# Patient Record
Sex: Female | Born: 1950 | Race: White | Hispanic: No | Smoking: Never smoker
Health system: Southern US, Community
[De-identification: ages and names within clinical notes are randomized; demographics above are authoritative.]

## PROBLEM LIST (undated history)

## (undated) DIAGNOSIS — K2970 Gastritis, unspecified, without bleeding: Secondary | ICD-10-CM

## (undated) DIAGNOSIS — E119 Type 2 diabetes mellitus without complications: Secondary | ICD-10-CM

## (undated) DIAGNOSIS — E039 Hypothyroidism, unspecified: Secondary | ICD-10-CM

## (undated) DIAGNOSIS — N819 Female genital prolapse, unspecified: Secondary | ICD-10-CM

## (undated) DIAGNOSIS — K219 Gastro-esophageal reflux disease without esophagitis: Secondary | ICD-10-CM

## (undated) DIAGNOSIS — I10 Essential (primary) hypertension: Secondary | ICD-10-CM

## (undated) DIAGNOSIS — K589 Irritable bowel syndrome without diarrhea: Secondary | ICD-10-CM

## (undated) DIAGNOSIS — R42 Dizziness and giddiness: Secondary | ICD-10-CM

## (undated) DIAGNOSIS — H409 Unspecified glaucoma: Secondary | ICD-10-CM

## (undated) DIAGNOSIS — N189 Chronic kidney disease, unspecified: Secondary | ICD-10-CM

## (undated) DIAGNOSIS — N952 Postmenopausal atrophic vaginitis: Secondary | ICD-10-CM

## (undated) DIAGNOSIS — N390 Urinary tract infection, site not specified: Secondary | ICD-10-CM

## (undated) DIAGNOSIS — K635 Polyp of colon: Secondary | ICD-10-CM

## (undated) DIAGNOSIS — E041 Nontoxic single thyroid nodule: Secondary | ICD-10-CM

## (undated) DIAGNOSIS — C801 Malignant (primary) neoplasm, unspecified: Secondary | ICD-10-CM

## (undated) DIAGNOSIS — G2581 Restless legs syndrome: Secondary | ICD-10-CM

## (undated) DIAGNOSIS — K5730 Diverticulosis of large intestine without perforation or abscess without bleeding: Secondary | ICD-10-CM

## (undated) HISTORY — PX: DILATION AND CURETTAGE OF UTERUS: SHX78

## (undated) HISTORY — PX: CHOLECYSTECTOMY: SHX55

## (undated) HISTORY — PX: TUBAL LIGATION: SHX77

---

## 2004-01-20 ENCOUNTER — Ambulatory Visit: Payer: Self-pay | Admitting: Internal Medicine

## 2004-08-19 ENCOUNTER — Ambulatory Visit: Payer: Self-pay | Admitting: Urology

## 2005-05-21 ENCOUNTER — Ambulatory Visit: Payer: Self-pay | Admitting: Internal Medicine

## 2006-07-08 ENCOUNTER — Ambulatory Visit: Payer: Self-pay | Admitting: Internal Medicine

## 2006-08-24 ENCOUNTER — Ambulatory Visit: Payer: Self-pay | Admitting: Unknown Physician Specialty

## 2006-09-01 ENCOUNTER — Ambulatory Visit: Payer: Self-pay | Admitting: Unknown Physician Specialty

## 2006-12-05 ENCOUNTER — Ambulatory Visit: Payer: Self-pay | Admitting: Internal Medicine

## 2007-01-25 ENCOUNTER — Ambulatory Visit: Payer: Self-pay | Admitting: Internal Medicine

## 2007-01-31 ENCOUNTER — Ambulatory Visit: Payer: Self-pay | Admitting: Internal Medicine

## 2007-03-30 ENCOUNTER — Ambulatory Visit: Payer: Self-pay | Admitting: Internal Medicine

## 2007-09-05 ENCOUNTER — Ambulatory Visit: Payer: Self-pay | Admitting: Internal Medicine

## 2007-10-14 ENCOUNTER — Emergency Department: Payer: Self-pay | Admitting: Emergency Medicine

## 2007-10-14 ENCOUNTER — Other Ambulatory Visit: Payer: Self-pay

## 2008-12-25 ENCOUNTER — Ambulatory Visit: Payer: Self-pay | Admitting: Internal Medicine

## 2009-06-02 ENCOUNTER — Ambulatory Visit: Payer: Self-pay | Admitting: Unknown Physician Specialty

## 2009-07-30 ENCOUNTER — Ambulatory Visit: Payer: Self-pay | Admitting: Unknown Physician Specialty

## 2010-04-30 ENCOUNTER — Ambulatory Visit: Payer: Self-pay | Admitting: Obstetrics and Gynecology

## 2010-04-30 ENCOUNTER — Ambulatory Visit: Payer: Self-pay | Admitting: Internal Medicine

## 2011-05-20 ENCOUNTER — Ambulatory Visit: Payer: Self-pay | Admitting: Obstetrics and Gynecology

## 2012-07-11 ENCOUNTER — Ambulatory Visit: Payer: Self-pay | Admitting: Internal Medicine

## 2013-01-22 ENCOUNTER — Ambulatory Visit: Payer: Self-pay | Admitting: Unknown Physician Specialty

## 2013-01-23 LAB — PATHOLOGY REPORT

## 2013-04-27 ENCOUNTER — Emergency Department: Payer: Self-pay | Admitting: Emergency Medicine

## 2013-04-27 LAB — BASIC METABOLIC PANEL
Anion Gap: 5 — ABNORMAL LOW (ref 7–16)
BUN: 16 mg/dL (ref 7–18)
Calcium, Total: 9.3 mg/dL (ref 8.5–10.1)
Chloride: 102 mmol/L (ref 98–107)
Co2: 28 mmol/L (ref 21–32)
Creatinine: 0.76 mg/dL (ref 0.60–1.30)
EGFR (Non-African Amer.): >60
Glucose: 230 mg/dL — ABNORMAL HIGH (ref 65–99)
Osmolality: 279 (ref 275–301)
Potassium: 3.6 mmol/L (ref 3.5–5.1)
Sodium: 135 mmol/L — ABNORMAL LOW (ref 136–145)

## 2013-04-27 LAB — URINALYSIS, COMPLETE
BACTERIA: NONE SEEN
BILIRUBIN, UR: NEGATIVE
BLOOD: NEGATIVE
Glucose,UR: 150 mg/dL (ref 0–75)
KETONE: NEGATIVE
NITRITE: NEGATIVE
PH: 7 (ref 4.5–8.0)
PROTEIN: NEGATIVE
RBC,UR: <1 /HPF (ref 0–5)
SPECIFIC GRAVITY: 1.005 (ref 1.003–1.030)
Squamous Epithelial: 1

## 2013-04-27 LAB — CBC WITH DIFFERENTIAL/PLATELET
BASOS PCT: 0.9 %
Basophil #: 0.1 10*3/uL (ref 0.0–0.1)
EOS ABS: 0.2 10*3/uL (ref 0.0–0.7)
EOS PCT: 2.9 %
HCT: 37 % (ref 35.0–47.0)
HGB: 12.8 g/dL (ref 12.0–16.0)
LYMPHS ABS: 2.6 10*3/uL (ref 1.0–3.6)
Lymphocyte %: 45.1 %
MCH: 30 pg (ref 26.0–34.0)
MCHC: 34.7 g/dL (ref 32.0–36.0)
MCV: 86 fL (ref 80–100)
MONO ABS: 0.4 x10 3/mm (ref 0.2–0.9)
Monocyte %: 6.5 %
NEUTROS PCT: 44.6 %
Neutrophil #: 2.6 10*3/uL (ref 1.4–6.5)
Platelet: 249 10*3/uL (ref 150–440)
RBC: 4.28 10*6/uL (ref 3.80–5.20)
RDW: 12.9 % (ref 11.5–14.5)
WBC: 5.8 10*3/uL (ref 3.6–11.0)

## 2013-04-27 LAB — TROPONIN I: Troponin-I: <0.02 ng/mL

## 2013-04-29 LAB — URINE CULTURE

## 2013-09-11 ENCOUNTER — Ambulatory Visit: Payer: Self-pay | Admitting: Obstetrics and Gynecology

## 2013-10-18 ENCOUNTER — Ambulatory Visit: Payer: Self-pay | Admitting: Internal Medicine

## 2015-01-16 ENCOUNTER — Encounter
Admission: RE | Admit: 2015-01-16 | Discharge: 2015-01-16 | Disposition: A | Discharge status: Home / Self Care | Payer: 59 | Source: Ambulatory Visit | Attending: Otolaryngology | Admitting: Otolaryngology

## 2015-01-16 DIAGNOSIS — Z01812 Encounter for preprocedural laboratory examination: Secondary | ICD-10-CM | POA: Insufficient documentation

## 2015-01-16 DIAGNOSIS — Z01810 Encounter for preprocedural cardiovascular examination: Secondary | ICD-10-CM | POA: Insufficient documentation

## 2015-01-16 HISTORY — DX: Postmenopausal atrophic vaginitis: N95.2

## 2015-01-16 HISTORY — DX: Type 2 diabetes mellitus without complications: E11.9

## 2015-01-16 HISTORY — DX: Female genital prolapse, unspecified: N81.9

## 2015-01-16 HISTORY — DX: Gastro-esophageal reflux disease without esophagitis: K21.9

## 2015-01-16 HISTORY — DX: Polyp of colon: K63.5

## 2015-01-16 HISTORY — DX: Restless legs syndrome: G25.81

## 2015-01-16 HISTORY — DX: Diverticulosis of large intestine without perforation or abscess without bleeding: K57.30

## 2015-01-16 HISTORY — DX: Dizziness and giddiness: R42

## 2015-01-16 HISTORY — DX: Chronic kidney disease, unspecified: N18.9

## 2015-01-16 HISTORY — DX: Unspecified glaucoma: H40.9

## 2015-01-16 HISTORY — DX: Nontoxic single thyroid nodule: E04.1

## 2015-01-16 HISTORY — DX: Irritable bowel syndrome without diarrhea: K58.9

## 2015-01-16 HISTORY — DX: Essential (primary) hypertension: I10

## 2015-01-16 HISTORY — DX: Malignant (primary) neoplasm, unspecified: C80.1

## 2015-01-16 HISTORY — DX: Gastritis, unspecified, without bleeding: K29.70

## 2015-01-16 HISTORY — DX: Urinary tract infection, site not specified: N39.0

## 2015-01-16 LAB — CBC
HEMATOCRIT: 38 % (ref 35.0–47.0)
HEMOGLOBIN: 12.9 g/dL (ref 12.0–16.0)
MCH: 29 pg (ref 26.0–34.0)
MCHC: 33.9 g/dL (ref 32.0–36.0)
MCV: 85.4 fL (ref 80.0–100.0)
Platelets: 283 10*3/uL (ref 150–440)
RBC: 4.45 MIL/uL (ref 3.80–5.20)
RDW: 13.6 % (ref 11.5–14.5)
WBC: 5.5 10*3/uL (ref 3.6–11.0)

## 2015-01-16 LAB — BASIC METABOLIC PANEL
Anion gap: 7 (ref 5–15)
BUN: 14 mg/dL (ref 6–20)
CHLORIDE: 104 mmol/L (ref 101–111)
CO2: 27 mmol/L (ref 22–32)
Calcium: 9.7 mg/dL (ref 8.9–10.3)
Creatinine, Ser: 0.63 mg/dL (ref 0.44–1.00)
GFR calc non Af Amer: >60 mL/min (ref >60)
Glucose, Bld: 274 mg/dL — ABNORMAL HIGH (ref 65–99)
POTASSIUM: 4.2 mmol/L (ref 3.5–5.1)
SODIUM: 138 mmol/L (ref 135–145)

## 2015-01-16 NOTE — Patient Instructions (Signed)
  Your procedure is scheduled on: January 29, 2015 (Wednesday) Report to Day Surgery.[facility]) To find out your arrival time please call [phone] between 1PM - 3PM on January 28, 2015 (Tuesday).  Remember: Instructions that are not followed completely may result in serious medical risk, up to and including death, or upon the discretion of your surgeon and anesthesiologist your surgery may need to be rescheduled.    __x__ 1. Do not eat food or drink liquids after midnight. No gum chewing or hard candies.     __x__ 2. No Alcohol for 24 hours before or after surgery.   ____ 3. Bring all medications with you on the day of surgery if instructed.    __x__ 4. Notify your doctor if there is any change in your medical condition     (cold, fever, infections).     Do not wear jewelry, make-up, hairpins, clips or nail polish.  Do not wear lotions, powders, or perfumes. You may wear deodorant.  Do not shave 48 hours prior to surgery. Men may shave face and neck.  Do not bring valuables to the hospital.    [facility] is not responsible for any belongings or valuables.               Contacts, dentures or bridgework may not be worn into surgery.  Leave your suitcase in the car. After surgery it may be brought to your room.  For patients admitted to the hospital, discharge time is determined by your                treatment team.   Patients discharged the day of surgery will not be allowed to drive home.   Please read over the following fact sheets that you were given:      __x__ Take these medicines the morning of surgery with A SIP OF WATER:    1. Ranitidine (Zantac)  2.   3.   4.  5.  6.  ____ Fleet Enema (as directed)   ____ Use CHG Soap as directed  ____ Use inhalers on the day of surgery  _x___ Stop metformin 2 days prior to surgery (STOP METFORMIN ON November 28)    _x___ Take 1/2 of usual insulin dose the night before surgery and none on the morning of surgery. (NO  VICTOZA THE MORNING OF SURGERY)  ____ Stop Coumadin/Plavix/aspirin on (PATIENT HAS STOPPED ASPIRIN )  __x__ Stop Anti-inflammatories on (TYLENOL OK TO TAKE FOR PAIN IF NEEDED)   ____ Stop supplements until after surgery.    ____ Bring C-Pap to the hospital.

## 2015-01-16 NOTE — OR Nursing (Signed)
Ekg to anesthesia for review 

## 2015-01-20 NOTE — Pre-Procedure Instructions (Signed)
EKG reviewed by Dr. Thomas and "ok for surgery"  

## 2015-01-22 ENCOUNTER — Encounter: Payer: Self-pay | Admitting: Urology

## 2015-01-22 ENCOUNTER — Ambulatory Visit (INDEPENDENT_AMBULATORY_CARE_PROVIDER_SITE_OTHER): Payer: 59 | Admitting: Urology

## 2015-01-22 VITALS — BP 155/79 | HR 82 | Ht 61.0 in | Wt 141.6 lb

## 2015-01-22 DIAGNOSIS — N952 Postmenopausal atrophic vaginitis: Secondary | ICD-10-CM | POA: Diagnosis not present

## 2015-01-22 DIAGNOSIS — N950 Postmenopausal bleeding: Secondary | ICD-10-CM

## 2015-01-22 DIAGNOSIS — N390 Urinary tract infection, site not specified: Secondary | ICD-10-CM | POA: Diagnosis not present

## 2015-01-22 NOTE — Progress Notes (Signed)
01/22/2015 8:29 PM   Lemar Lofty Aug 06, 1950 [id number]  Referring provider: No referring provider defined for this encounter.  Chief Complaint  Patient presents with  . atrophic vaginitis    one year recheck    HPI: Patient is a 64 year old white female with a history of recurrent UTI's and atrophic vaginitis who presents today for a yearly follow up.    Recurrent UTI's Patient has had three E. Coli UTI's over the last year with a variable resistance pattern. (07/09/215, 04/04/2014, and 04/17/2014).  She underwent a pelvic US in 2015 and was found to have an uterine fibroid.  She underwent a cystoscopy on 10/29/2013 and no pathology was found.  She was placed on vaginal estrogen cream, but she has been hesitant to apply the medication.  She has not applied any cream for several months.  She takes a daily Macrobid prescribed by her PCP and that has kept her UTI's at [place].  Atrophic vaginitis Patient has not been using her vaginal estrogen cream.  She is afraid it will cause an UTI.  I explained how an atrophic vaginitis can contribute to recurrent UTI's and how the cream may prevent infections.    PMH: Past Medical History  Diagnosis Date  . Hypertension   . Diabetes mellitus without complication ([place])   . GERD (gastroesophageal reflux disease)   . Chronic kidney disease     UTI  . Cancer (HCC)     Skin  . Diverticulosis of colon   . Restless leg syndrome   . Glaucoma (increased eye pressure)   . Vertigo   . Thyroid nodule   . Gastritis   . Colon polyps   . IBS (irritable bowel syndrome)   . Atrophic vaginitis   . 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 UTI (lower urinary tract infection)     Surgical History: Past Surgical History  Procedure Laterality Date  . Cholecystectomy    . Tubal ligation    . Dilation and curettage of uterus      Home Medications:    Medication List       This list is accurate as of: 01/22/15 11:59 PM.  Always use your most  recent med list.               aspirin EC 81 MG tablet  Take 81 mg by mouth daily.     CALCIUM 500+D3 PO  Take 1 tablet by mouth 2 (two) times daily.     Fish Oil 1000 MG Caps  Take by mouth.     glipiZIDE 10 MG tablet  Commonly known as:  GLUCOTROL  Take 10 mg by mouth 2 (two) times daily after a meal.     hydrochlorothiazide 12.5 MG capsule  Commonly known as:  MICROZIDE  Take 12.5 mg by mouth daily.     latanoprost 0.005 % ophthalmic solution  Commonly known as:  XALATAN  Place 1 drop into both eyes at bedtime.     losartan 50 MG tablet  Commonly known as:  COZAAR  Take 50 mg by mouth at bedtime.     metFORMIN 1000 MG tablet  Commonly known as:  GLUCOPHAGE  Take 1,000 mg by mouth 2 (two) times daily with a meal.     NITROFURANTOIN MONOHYD MACRO PO  Take 100 mg by mouth daily.     ONE TOUCH ULTRA MINI W/DEVICE Kit  Use as directed. Dx code: E11.9     pravastatin 40 MG  tablet  Commonly known as:  PRAVACHOL  Take 40 mg by mouth at bedtime.     ranitidine 300 MG tablet  Commonly known as:  ZANTAC  Take 300 mg by mouth 2 (two) times daily.     VICTOZA 18 MG/3ML Sopn  Generic drug:  Liraglutide  Inject into the skin daily before breakfast.        Allergies:  Allergies  Allergen Reactions  . Lisinopril Cough  . Sulfa Antibiotics Swelling    Family History: Family History  Problem Relation Age of Onset  . Stomach cancer Father   . Heart attack Mother   . Diabetes Mellitus II Mother   . Diabetes Mellitus II Maternal Grandmother   . Kidney disease Father   . Prostate cancer Neg Hx   . Bladder Cancer Neg Hx     Social History:  reports that she has never smoked. She has never used smokeless tobacco. She reports that she drinks alcohol. She reports that she does not use illicit drugs.  ROS: UROLOGY Frequent Urination?: No Hard to postpone urination?: No Burning/pain with urination?: No Get up at night to urinate?: No Leakage of urine?:  No Urine stream starts and stops?: No Trouble starting stream?: No Do you have to strain to urinate?: No Blood in urine?: No Urinary tract infection?: No Sexually transmitted disease?: No Injury to kidneys or bladder?: No Painful intercourse?: No Weak stream?: No Currently pregnant?: No Vaginal bleeding?: No Last menstrual period?: n  Gastrointestinal Nausea?: No Vomiting?: No Indigestion/heartburn?: No Diarrhea?: No Constipation?: No  Constitutional Fever: No Night sweats?: No Weight loss?: No Fatigue?: No  Skin Skin rash/lesions?: No Itching?: No  Eyes Blurred vision?: No Double vision?: No  Ears/Nose/Throat Sore throat?: No Sinus problems?: No  Hematologic/Lymphatic Swollen glands?: No Easy bruising?: No  Cardiovascular Leg swelling?: No Chest pain?: No  Respiratory Cough?: No Shortness of breath?: No  Endocrine Excessive thirst?: No  Musculoskeletal Back pain?: No Joint pain?: No  Neurological Headaches?: No Dizziness?: No  Psychologic Depression?: No Anxiety?: No  Physical Exam: BP 155/79 mmHg  Pulse 82  Ht '5\' 1"'  (1.549 m)  Wt 141 lb 9.6 oz (64.229 kg)  BMI 26.77 kg/m2  Constitutional: Well nourished. Alert and oriented, No acute distress. HEENT: [place] AT, moist mucus membranes. Trachea midline, no masses. Cardiovascular: No clubbing, cyanosis, or edema. Respiratory: Normal respiratory effort, no increased work of breathing. GI: Abdomen is soft, non tender, non distended, no abdominal masses. Liver and spleen not palpable.  No hernias appreciated.  Stool sample for occult testing is not indicated.   GU: No CVA tenderness.  No bladder fullness or masses.  Atrophic external genitalia, normal pubic hair distribution, no lesions.  Normal urethral meatus, no lesions, no prolapse, no discharge.   No urethral masses, tenderness and/or tenderness. No bladder fullness, tenderness or masses. Normal vagina mucosa, good estrogen effect, bloody  discharge, no lesions, good pelvic support, no cystocele or rectocele noted.  No cervical motion tenderness.  Uterus is freely mobile and non-fixed.  No adnexal/parametria masses or tenderness noted.  Anus and perineum are without rashes or lesions.    Skin: No rashes, bruises or suspicious lesions. Lymph: No cervical or inguinal adenopathy. Neurologic: Grossly intact, no focal deficits, moving all 4 extremities. Psychiatric: Normal mood and affect.  Laboratory Data: Lab Results  Component Value Date   WBC 5.5 01/16/2015   HGB 12.9 01/16/2015   HCT 38.0 01/16/2015   MCV 85.4 01/16/2015   PLT 283 01/16/2015  Lab Results  Component Value Date   CREATININE 0.63 01/16/2015    Assessment & Plan:    1. Post-menopausal bleeding:   We will refer to Dr. Ouida Sills at [facility] for further evaluation and management.    2. Recurrent UTI's:   Patient's PCP prescribes daily Macrobid for the patient.    3. Atrophic vaginitis:   Patient has not been applying her vaginal estrogen cream.    There are no diagnoses linked to this encounter.  Return for refer to Dr. Ouida Sills for post menopausal  bleeding.  Zara Council, [facility] [address] [place], [place] [address] [phone]

## 2015-01-26 DIAGNOSIS — N952 Postmenopausal atrophic vaginitis: Secondary | ICD-10-CM | POA: Insufficient documentation

## 2015-01-26 DIAGNOSIS — N390 Urinary tract infection, site not specified: Secondary | ICD-10-CM | POA: Insufficient documentation

## 2015-01-29 ENCOUNTER — Ambulatory Visit: Payer: 59 | Admitting: *Deleted

## 2015-01-29 ENCOUNTER — Observation Stay
Admission: RE | Admit: 2015-01-29 | Discharge: 2015-01-30 | Disposition: A | Discharge status: Home / Self Care | Payer: 59 | Source: Ambulatory Visit | Attending: Otolaryngology | Admitting: Otolaryngology

## 2015-01-29 ENCOUNTER — Encounter: Payer: Self-pay | Admitting: *Deleted

## 2015-01-29 ENCOUNTER — Encounter
Admission: RE | Disposition: A | Discharge status: Home / Self Care | Payer: Self-pay | Source: Ambulatory Visit | Attending: Otolaryngology

## 2015-01-29 DIAGNOSIS — Z9851 Tubal ligation status: Secondary | ICD-10-CM | POA: Diagnosis not present

## 2015-01-29 DIAGNOSIS — Z9889 Other specified postprocedural states: Secondary | ICD-10-CM | POA: Diagnosis not present

## 2015-01-29 DIAGNOSIS — Z882 Allergy status to sulfonamides status: Secondary | ICD-10-CM | POA: Insufficient documentation

## 2015-01-29 DIAGNOSIS — E063 Autoimmune thyroiditis: Secondary | ICD-10-CM | POA: Insufficient documentation

## 2015-01-29 DIAGNOSIS — D34 Benign neoplasm of thyroid gland: Secondary | ICD-10-CM | POA: Diagnosis not present

## 2015-01-29 DIAGNOSIS — Z79899 Other long term (current) drug therapy: Secondary | ICD-10-CM | POA: Diagnosis not present

## 2015-01-29 DIAGNOSIS — E041 Nontoxic single thyroid nodule: Secondary | ICD-10-CM | POA: Diagnosis present

## 2015-01-29 DIAGNOSIS — E785 Hyperlipidemia, unspecified: Secondary | ICD-10-CM | POA: Diagnosis not present

## 2015-01-29 DIAGNOSIS — I10 Essential (primary) hypertension: Secondary | ICD-10-CM | POA: Diagnosis not present

## 2015-01-29 DIAGNOSIS — Z7982 Long term (current) use of aspirin: Secondary | ICD-10-CM | POA: Insufficient documentation

## 2015-01-29 DIAGNOSIS — Z794 Long term (current) use of insulin: Secondary | ICD-10-CM | POA: Diagnosis not present

## 2015-01-29 DIAGNOSIS — Z9049 Acquired absence of other specified parts of digestive tract: Secondary | ICD-10-CM | POA: Diagnosis not present

## 2015-01-29 DIAGNOSIS — E119 Type 2 diabetes mellitus without complications: Secondary | ICD-10-CM | POA: Insufficient documentation

## 2015-01-29 DIAGNOSIS — Z888 Allergy status to other drugs, medicaments and biological substances status: Secondary | ICD-10-CM | POA: Insufficient documentation

## 2015-01-29 HISTORY — PX: THYROIDECTOMY: SHX17

## 2015-01-29 LAB — CALCIUM: Calcium: 8.6 mg/dL — ABNORMAL LOW (ref 8.9–10.3)

## 2015-01-29 LAB — GLUCOSE, CAPILLARY
Glucose-Capillary: 135 mg/dL — ABNORMAL HIGH (ref 65–99)
Glucose-Capillary: 212 mg/dL — ABNORMAL HIGH (ref 65–99)
Glucose-Capillary: 195 mg/dL — ABNORMAL HIGH (ref 65–99)
Glucose-Capillary: 215 mg/dL — ABNORMAL HIGH (ref 65–99)
Glucose-Capillary: 176 mg/dL — ABNORMAL HIGH (ref 65–99)

## 2015-01-29 SURGERY — THYROIDECTOMY
Anesthesia: General | Site: Neck | Wound class: Clean

## 2015-01-29 MED ORDER — REMIFENTANIL HCL 1 MG IV SOLR
INTRAVENOUS | Status: DC | PRN
  Administered 2015-01-29: .02 ug/kg/min via INTRAVENOUS

## 2015-01-29 MED ORDER — MIDAZOLAM HCL 2 MG/2ML IJ SOLN
INTRAMUSCULAR | Status: DC | PRN
Start: 2015-01-29 — End: 2015-01-29
  Administered 2015-01-29: 2 mg via INTRAVENOUS

## 2015-01-29 MED ORDER — LIDOCAINE-EPINEPHRINE (PF) 1 %-1:200000 IJ SOLN
INTRAMUSCULAR | Status: DC | PRN
  Administered 2015-01-29: 5 mL

## 2015-01-29 MED ORDER — BACITRACIN 500 UNIT/GM EX OINT
1.0000 "application " | TOPICAL_OINTMENT | Freq: Three times a day (TID) | CUTANEOUS | Status: DC
  Administered 2015-01-29 – 2015-01-30 (×3): 1 via TOPICAL
  Filled 2015-01-29 (×6): qty 0.9

## 2015-01-29 MED ORDER — DOCUSATE SODIUM 100 MG PO CAPS
100.0000 mg | ORAL_CAPSULE | Freq: Two times a day (BID) | ORAL | Status: DC
  Administered 2015-01-29 (×2): 100 mg via ORAL
  Filled 2015-01-29 (×2): qty 1

## 2015-01-29 MED ORDER — INSULIN ASPART 100 UNIT/ML ~~LOC~~ SOLN
0.0000 [IU] | Freq: Three times a day (TID) | SUBCUTANEOUS | Status: DC
  Administered 2015-01-29: 5 [IU] via SUBCUTANEOUS
  Administered 2015-01-30: 3 [IU] via SUBCUTANEOUS
  Filled 2015-01-29: qty 3
  Filled 2015-01-29: qty 5

## 2015-01-29 MED ORDER — BACITRACIN ZINC 500 UNIT/GM EX OINT
TOPICAL_OINTMENT | CUTANEOUS | Status: AC
  Filled 2015-01-29: qty 28.35

## 2015-01-29 MED ORDER — KCL IN DEXTROSE-NACL 20-5-0.45 MEQ/L-%-% IV SOLN
INTRAVENOUS | Status: DC
  Administered 2015-01-29: 1000 mL via INTRAVENOUS
  Filled 2015-01-29 (×3): qty 1000

## 2015-01-29 MED ORDER — MORPHINE SULFATE (PF) 2 MG/ML IV SOLN
2.0000 mg | INTRAVENOUS | Status: DC | PRN
  Administered 2015-01-29 (×2): 2 mg via INTRAVENOUS
  Filled 2015-01-29 (×2): qty 1

## 2015-01-29 MED ORDER — HYDROCODONE-ACETAMINOPHEN 5-325 MG PO TABS
1.0000 | ORAL_TABLET | ORAL | Status: DC | PRN
  Administered 2015-01-29 – 2015-01-30 (×2): 1 via ORAL
  Filled 2015-01-29 (×2): qty 1

## 2015-01-29 MED ORDER — LIDOCAINE HCL (CARDIAC) 20 MG/ML IV SOLN
INTRAVENOUS | Status: DC | PRN
  Administered 2015-01-29: 40 mg via INTRAVENOUS

## 2015-01-29 MED ORDER — ONDANSETRON HCL 4 MG/2ML IJ SOLN: 4.0000 mg | INTRAMUSCULAR | Status: DC | PRN

## 2015-01-29 MED ORDER — LIDOCAINE-EPINEPHRINE (PF) 1 %-1:200000 IJ SOLN
INTRAMUSCULAR | Status: AC
  Filled 2015-01-29: qty 30

## 2015-01-29 MED ORDER — ONDANSETRON HCL 4 MG PO TABS: 4.0000 mg | ORAL_TABLET | ORAL | Status: DC | PRN

## 2015-01-29 MED ORDER — HYDROMORPHONE HCL 1 MG/ML IJ SOLN
0.2500 mg | INTRAMUSCULAR | Status: DC | PRN
  Administered 2015-01-29 (×2): 0.25 mg via INTRAVENOUS
  Administered 2015-01-29: 0.5 mg via INTRAVENOUS

## 2015-01-29 MED ORDER — ONDANSETRON HCL 4 MG/2ML IJ SOLN: 4.0000 mg | Freq: Once | INTRAMUSCULAR | Status: DC | PRN

## 2015-01-29 MED ORDER — SODIUM CHLORIDE 0.9 % IV SOLN
INTRAVENOUS | Status: DC
  Administered 2015-01-29 (×3): via INTRAVENOUS

## 2015-01-29 MED ORDER — FENTANYL CITRATE (PF) 100 MCG/2ML IJ SOLN
INTRAMUSCULAR | Status: AC
  Filled 2015-01-29: qty 2

## 2015-01-29 MED ORDER — PHENYLEPHRINE HCL 10 MG/ML IJ SOLN
INTRAMUSCULAR | Status: DC | PRN
  Administered 2015-01-29 (×2): 100 ug via INTRAVENOUS

## 2015-01-29 MED ORDER — ROCURONIUM BROMIDE 100 MG/10ML IV SOLN
INTRAVENOUS | Status: DC | PRN
  Administered 2015-01-29: 20 mg via INTRAVENOUS

## 2015-01-29 MED ORDER — SUCCINYLCHOLINE CHLORIDE 20 MG/ML IJ SOLN
INTRAMUSCULAR | Status: DC | PRN
  Administered 2015-01-29: 120 mg via INTRAVENOUS

## 2015-01-29 MED ORDER — PROPOFOL 10 MG/ML IV BOLUS
INTRAVENOUS | Status: DC | PRN
  Administered 2015-01-29: 30 mg via INTRAVENOUS
  Administered 2015-01-29: 40 mg via INTRAVENOUS
  Administered 2015-01-29: 130 mg via INTRAVENOUS

## 2015-01-29 MED ORDER — FENTANYL CITRATE (PF) 100 MCG/2ML IJ SOLN
INTRAMUSCULAR | Status: DC | PRN
  Administered 2015-01-29 (×3): 50 ug via INTRAVENOUS

## 2015-01-29 MED ORDER — ONDANSETRON HCL 4 MG/2ML IJ SOLN
INTRAMUSCULAR | Status: DC | PRN
  Administered 2015-01-29: 4 mg via INTRAVENOUS

## 2015-01-29 MED ORDER — HYDROMORPHONE HCL 1 MG/ML IJ SOLN
INTRAMUSCULAR | Status: AC
  Filled 2015-01-29: qty 1

## 2015-01-29 SURGICAL SUPPLY — 33 items
BLADE SURG 15 STRL LF DISP TIS (BLADE) ×1 IMPLANT
BLADE SURG 15 STRL SS (BLADE) ×2
CANISTER SUCT 1200ML W/VALVE (MISCELLANEOUS) ×3 IMPLANT
CORD BIP STRL DISP 12FT (MISCELLANEOUS) ×3 IMPLANT
DRAIN TLS ROUND 10FR (DRAIN) ×6 IMPLANT
DRAPE MAG INST 16X20 L/F (DRAPES) ×3 IMPLANT
DRSG TEGADERM 2-3/8X2-3/4 SM (GAUZE/BANDAGES/DRESSINGS) ×3 IMPLANT
ELECT LARYNGEAL 6/7 (MISCELLANEOUS)
ELECT LARYNGEAL 8/9 (MISCELLANEOUS) ×3
ELECTRODE LARYNGEAL 6/7 (MISCELLANEOUS) IMPLANT
ELECTRODE LARYNGEAL 8/9 (MISCELLANEOUS) ×1 IMPLANT
FORCEPS JEWEL BIP 4-3/4 STR (INSTRUMENTS) ×3 IMPLANT
GLOVE BIO SURGEON STRL SZ7.5 (GLOVE) ×6 IMPLANT
GOWN STRL REUS W/ TWL LRG LVL3 (GOWN DISPOSABLE) ×2 IMPLANT
GOWN STRL REUS W/TWL LRG LVL3 (GOWN DISPOSABLE) ×4
HARMONIC SCALPEL FOCUS (MISCELLANEOUS) ×3 IMPLANT
HEMOSTAT SURGICEL 2X3 (HEMOSTASIS) ×3 IMPLANT
HOOK STAY BLUNT/RETRACTOR 5M (MISCELLANEOUS) ×3 IMPLANT
KIT RM TURNOVER STRD PROC AR (KITS) ×3 IMPLANT
LABEL OR SOLS (LABEL) ×3 IMPLANT
NS IRRIG 500ML POUR BTL (IV SOLUTION) ×3 IMPLANT
PACK HEAD/NECK (MISCELLANEOUS) ×3 IMPLANT
PAD GROUND ADULT SPLIT (MISCELLANEOUS) ×3 IMPLANT
PROBE NEUROSIGN BIPOL (MISCELLANEOUS) ×1 IMPLANT
PROBE NEUROSIGN BIPOLAR (MISCELLANEOUS) ×2
SPONGE KITTNER 5P (MISCELLANEOUS) ×3 IMPLANT
SPONGE XRAY 4X4 16PLY STRL (MISCELLANEOUS) ×9 IMPLANT
SUT PROLENE 3 0 PS 2 (SUTURE) ×3 IMPLANT
SUT SILK 2 0 (SUTURE)
SUT SILK 2 0 SH (SUTURE) ×3 IMPLANT
SUT SILK 2-0 18XBRD TIE 12 (SUTURE) IMPLANT
SUT VIC AB 4-0 RB1 18 (SUTURE) ×3 IMPLANT
SYSTEM CHEST DRAIN TLS 7FR (DRAIN) IMPLANT

## 2015-01-29 NOTE — Anesthesia Procedure Notes (Addendum)
Procedure Name: Intubation Date/Time: 01/29/2015 7:45 AM Performed by: Allean Found Pre-anesthesia Checklist: Patient identified, Emergency Drugs available, Suction available, Patient being monitored and Timeout performed Patient Re-evaluated:Patient Re-evaluated prior to inductionOxygen Delivery Method: Circle system utilized Preoxygenation: Pre-oxygenation with 100% oxygen Intubation Type: IV induction Laryngoscope Size: McGraph and 3 Grade View: Grade I Number of attempts: 1 Placement Confirmation: ETT inserted through vocal cords under direct vision,  CO2 detector and breath sounds checked- equal and bilateral Secured at: 22 cm Tube secured with: Tape Dental Injury: Teeth and Oropharynx as per pre-operative assessment  Comments: Added laryngeal nerve monitor to ett, placd between cords with Dr Richardson Landry watching [place].  Good placement.

## 2015-01-29 NOTE — Anesthesia Preprocedure Evaluation (Signed)
Anesthesia Evaluation  Patient identified by MRN, date of birth, ID band Patient awake    Reviewed: Allergy & Precautions, NPO status , Patient's Chart, lab work & pertinent test results  Airway Mallampati: II  TM Distance: >3 FB Neck ROM: Limited    Dental  (+) Teeth Intact   Pulmonary    Pulmonary exam normal        Cardiovascular Exercise Tolerance: Good hypertension, Pt. on medications Normal cardiovascular exam  Recent stress echo is good.   Neuro/Psych    GI/Hepatic GERD  Medicated and Controlled,  Endo/Other  diabetes, Type 2BG 135.  Renal/GU      Musculoskeletal   Abdominal (+)  Abdomen: soft.    Peds  Hematology   Anesthesia Other Findings   Reproductive/Obstetrics                             Anesthesia Physical Anesthesia Plan  ASA: III  Anesthesia Plan: General   Post-op Pain Management:    Induction: Intravenous  Airway Management Planned: Video Laryngoscope Planned and Oral ETT  Additional Equipment:   Intra-op Plan:   Post-operative Plan: Extubation in OR  Informed Consent: I have reviewed the patients History and Physical, chart, labs and discussed the procedure including the risks, benefits and alternatives for the proposed anesthesia with the patient or authorized representative who has indicated his/her understanding and acceptance.     Plan Discussed with: CRNA  Anesthesia Plan Comments:         Anesthesia Quick Evaluation

## 2015-01-29 NOTE — Anesthesia Postprocedure Evaluation (Signed)
Anesthesia Post Note  Patient: Sheena Morgan  Procedure(s) Performed: Procedure(s) (LRB): THYROIDECTOMY/ LARYNGEAL NERVE MONITORING. (N/A)  Patient location during evaluation: PACU Anesthesia Type: General Level of consciousness: awake Pain management: pain level controlled Vital Signs Assessment: post-procedure vital signs reviewed and stable Respiratory status: spontaneous breathing Cardiovascular status: blood pressure returned to baseline Postop Assessment: no headache Anesthetic complications: no    Last Vitals:  Filed Vitals:   01/29/15 0607  BP: 122/103  Pulse: 80  Temp: 36.9 C  Resp: 14    Last Pain: There were no vitals filed for this visit.               Jene Huq M

## 2015-01-29 NOTE — OR Nursing (Signed)
Patient appears confused on arrival trying to sit up and very restless.

## 2015-01-29 NOTE — H&P (Signed)
History and physical reviewed and will be scanned in later. No change in medical status reported by the patient or family, appears stable for surgery. All questions regarding the procedure answered, and patient (or family if a child) expressed understanding of the procedure.  Sheena Morgan S @TODAY@ 

## 2015-01-29 NOTE — Transfer of Care (Signed)
Immediate Anesthesia Transfer of Care Note  Patient: Sheena Morgan  Procedure(s) Performed: Procedure(s): THYROIDECTOMY/ LARYNGEAL NERVE MONITORING. (N/A)  Patient Location: PACU  Anesthesia Type:General  Level of Consciousness: sedated  Airway & Oxygen Therapy: Patient Spontanous Breathing and Patient connected to face mask oxygen  Post-op Assessment: Report given to RN and Post -op Vital signs reviewed and stable  Post vital signs: Reviewed and stable  Last Vitals:  Filed Vitals:   01/29/15 0607 01/29/15 1020  BP: 122/103 150/85  Pulse: 80   Temp: 36.9 C 37.7 C  Resp: 14 20    Complications: No apparent anesthesia complications

## 2015-01-29 NOTE — Progress Notes (Signed)
[place], [place] [id number] 29-May-1950 Riley Nearing, MD   SUBJECTIVE: This 64 y.o. year old female is status post THYROIDECTOMY. Throat is sore, but she is minimally hoarse. Has been able to drink liquids without difficulty.  Medications:  Current Facility-Administered Medications  Medication Dose Route Frequency Provider Last Rate Last Dose  . bacitracin ointment 1 application  1 application Topical 3 times per day Clyde Canterbury, MD   1 application at 76/81/15 1400  . dextrose 5 % and 0.45 % NaCl with KCl 20 mEq/L infusion   Intravenous Continuous Clyde Canterbury, MD 100 mL/hr at 01/29/15 1328 1,000 mL at 01/29/15 1328  . docusate sodium (COLACE) capsule 100 mg  100 mg Oral BID Clyde Canterbury, MD   100 mg at 01/29/15 1328  . HYDROcodone-acetaminophen (NORCO/VICODIN) 5-325 MG per tablet 1-2 tablet  1-2 tablet Oral Q4H PRN Clyde Canterbury, MD      . HYDROmorphone (DILAUDID) 1 MG/ML injection           . insulin aspart (novoLOG) injection 0-15 Units  0-15 Units Subcutaneous TID WC Clyde Canterbury, MD   5 Units at 01/29/15 1718  . morphine 2 MG/ML injection 2-4 mg  2-4 mg Intravenous Q2H PRN Clyde Canterbury, MD   2 mg at 01/29/15 1711  . ondansetron (ZOFRAN) tablet 4 mg  4 mg Oral Q4H PRN Clyde Canterbury, MD       Or  . ondansetron [facility]) injection 4 mg  4 mg Intravenous Q4H PRN Clyde Canterbury, MD      .  Medications Prior to Admission  Medication Sig Dispense Refill  . Blood Glucose Monitoring Suppl (ONE TOUCH ULTRA MINI) W/DEVICE KIT Use as directed. Dx code: E11.9    . Calcium Carb-Cholecalciferol (CALCIUM 500+D3 PO) Take 1 tablet by mouth 2 (two) times daily.    [place] Kitchen glipiZIDE (GLUCOTROL) 10 MG tablet Take 10 mg by mouth 2 (two) times daily after a meal.    . hydrochlorothiazide (MICROZIDE) 12.5 MG capsule Take 12.5 mg by mouth daily.    Mar[place] Kitchen latanoprost (XALATAN) 0.005 % ophthalmic solution Place 1 drop into both eyes at bedtime.    . Liraglutide (VICTOZA) 18 MG/3ML SOPN Inject into the skin daily before  breakfast.    . losartan (COZAAR) 50 MG tablet Take 50 mg by mouth at bedtime.    Mar[place] Kitchen NITROFURANTOIN MONOHYD MACRO PO Take 100 mg by mouth daily.    . pravastatin (PRAVACHOL) 40 MG tablet Take 40 mg by mouth at bedtime.    . ranitidine (ZANTAC) 300 MG tablet Take 300 mg by mouth 2 (two) times daily.    Mar[place] Kitchen aspirin EC 81 MG tablet Take 81 mg by mouth daily.    . metFORMIN (GLUCOPHAGE) 1000 MG tablet Take 1,000 mg by mouth 2 (two) times daily with a meal.    . Omega-3 Fatty Acids (FISH OIL) 1000 MG CAPS Take by mouth.      OBJECTIVE:  PHYSICAL EXAM  Vitals: Blood pressure 146/72, pulse 91, temperature 98 F (36.7 C), temperature source Oral, resp. rate 17, height _0  (1.549 m), weight 63.957 kg (141 lb), SpO2 100 %.. General: Well-developed, Well-nourished in no acute distress Mood: Mood and affect well adjusted, pleasant and cooperative. Orientation: Grossly alert and oriented. Vocal Quality: Minimal hoarseness, but not breathy. Communicates verbally. head and Face: NCAT. No facial asymmetry. No visible skin lesions. No significant facial scars. No tenderness with sinus percussion. Facial strength normal and symmetric. Neck: Supple and symmetric with no palpable masses, tenderness or  crepitance. The trachea is midline. Neck wound is clean dry and intact with no hematoma Lymphatic: Cervical lymph nodes are without palpable lymphadenopathy or tenderness. Respiratory: Normal respiratory effort without labored breathing.  MEDICAL DECISION MAKING: Data Review:  Results for orders placed or performed during the hospital encounter of 01/29/15 (from the past 48 hour(s))  Glucose, capillary     Status: Abnormal   Collection Time: 01/29/15  6:08 AM  Result Value Ref Range   Glucose-Capillary 135 (H) 65 - 99 mg/dL   Comment 1 Notify RN   Glucose, capillary     Status: Abnormal   Collection Time: 01/29/15 10:45 AM  Result Value Ref Range   Glucose-Capillary 212 (H) 65 - 99 mg/dL  Glucose,  capillary     Status: Abnormal   Collection Time: 01/29/15  1:04 PM  Result Value Ref Range   Glucose-Capillary 195 (H) 65 - 99 mg/dL   Comment 1 Notify RN   Calcium     Status: Abnormal   Collection Time: 01/29/15  3:44 PM  Result Value Ref Range   Calcium 8.6 (L) 8.9 - 10.3 mg/dL  Glucose, capillary     Status: Abnormal   Collection Time: 01/29/15  4:43 PM  Result Value Ref Range   Glucose-Capillary 215 (H) 65 - 99 mg/dL   Comment 1 Notify RN   . No results found..   ASSESSMENT: Status post total thyroidectomy doing well.   PLAN: She had a minimal drop in her calcium at 8.6 which we will monitor. Will saline lock her IV since she is tolerating liquids and make sure her diet is ordered as a diabetic diet which can be advanced to a soft diabetic diet as tolerated. We'll recheck her serum calcium tomorrow morning, and hopefully be able to remove at least one of her drains at that point. It is possible she may be discharged tomorrow if the calcium looks stable and she is otherwise progressing.     Riley Nearing, MD 01/29/2015 6:18 PM

## 2015-01-29 NOTE — Op Note (Signed)
01/29/2015  10:14 AM    Lemar Lofty  [id number]   Pre-Op Diagnosis: LEFT THYROID NODULE Post-op Diagnosis: LEFT THYROID NODULE  Procedure:   TOTAL THYROIDECTOMY, LARYNGEAL NERVE MONITORING  Surgeon:  Riley Nearing   First Assistant: Margaretha Sheffield, MD   Anesthesia:  General endotracheal  EBL:  [id number]  Complications:  None  Findings: 5.5 cm left thyroid nodule. 1 cm nodule left superior pole   Procedure: After the patient was identified in holding and the procedure was reviewed.  The patient was taken to the operating room and with the patient in a comfortable supine position,  general endotracheal anesthesia was induced without difficulty.  A nerve monitor on the endotracheal tube was visualized to be between the cords at the time of intubation. A proper time-out was performed, confirming the operative site and procedure.  The patient was placed on a shoulder roll and position. Skin was injected with 1% lidocaine with epinephrine 1-200,000. The patient was then prepped and draped in the usual sterile fashion. A 15 blade was used to incise the skin carrying the incision down through the subcutaneous tissues. The platysma muscle was divided with the Bovie. The strap muscles were identified in the midline and divided in the midline, retracting them laterally. Small anterior jugular veins were either clamped and suture divided or divided with the Harmonic scalpel. The strap muscles were retracted laterally off of the capsule of the  lobe of the left thyroid gland. This was then finger dissected to loosen loose attachments to the gland. Dissection proceeded superiorly, dissecting the superior pole of the gland. The superior pole vessels were divided with the Harmonic scalpel. A small parathyroid gland was visualized and preserved during this dissection. The superior pole was retracted inferiorly and dissection proceeded around the lateral aspect of the gland, dividing vascular  attachments with the Harmonic scalpel. The inferior pole was dissected, and the inferior pole vessels were divided with the Harmonic scalpel. Care was taken to divide all of these vessels right at the capsule of the gland. The nodule was very large and dissecting more medially and deep to the gland was not possible without delivering the lobe, which required dividing the strap muscles partially on the left with the harmonic scalpel. The gland was then retracted medially and delivered from the wound. Dissection along the trachea revealed the recurrent laryngeal nerve which stimulated properly. The gland was then dissected off of the trachea, dividing Berry's ligament with the nerve carefully protected. The left lobe of the gland was then divided at the isthmus and set aside. It was marked with a suture at the superior pole.  Next the right lobe of the thyroid gland was dissected in the same fashion as described above. Once again the superior and inferior pole vessels were divided right at the capsule of the gland and a structure consistent with parathyroid tissue was identified inferiorly. The superior parathyroid was not visualized, but may have been within fatty tissue in that region. Retracting the gland medially the recurrent laryngeal nerve was identified and confirmed with the stimulator. This was then carefully protected as the gland was dissected away from the trachea and Berry's ligament divided. The right lobe was delivered and sent as a specimen with the superior pole marked with a stitch. Both lobes were inspected and no adherent parathyroid tissue was seen.  The wound was then irrigated and inspected for bleeding. #10 TLS drains were placed on either side of the trachea with the drains  coming out through the skin just below the wound. The drains were secured with 2-0 silk suture. Surgicel was placed on either side of the trachea to control minor oozing within the wound. The strap muscles were then  reapproximated with 4-0 Vicryl suture, repairing the cut strap muscle on the left side. The platysma and subcutaneous tissues were also closed with 4-0 Vicryl suture. The skin was closed with a 3-0 running subcuticular Prolene suture. The patient was then returned to the anesthesiologist for awakening. The patient was awakened and taken to recovery room in good condition postoperatively.  Disposition:   PACU then transferred to floor  Plan: The patient is to be admitted for observation and monitoring of serum calcium, with potential discharge tomorrow if serum calcium is stable overnight.  Riley Nearing 01/29/2015 10:14 AM

## 2015-01-29 NOTE — Transfer of Care (Signed)
Immediate Anesthesia Transfer of Care Note  Patient: Sheena Morgan  Procedure(s) Performed: Procedure(s): THYROIDECTOMY/ LARYNGEAL NERVE MONITORING. (N/A)  Patient Location: PACU  Anesthesia Type:General  Level of Consciousness: sedated  Airway & Oxygen Therapy: Patient Spontanous Breathing and Patient connected to face mask oxygen  Post-op Assessment: Report given to RN and Post -op Vital signs reviewed and stable  Post vital signs: Reviewed and stable  Last Vitals:  Filed Vitals:   01/29/15 0607  BP: 122/103  Pulse: 80  Temp: 36.9 C  Resp: 14    Complications: No apparent anesthesia complications

## 2015-01-29 NOTE — OR Nursing (Signed)
Neuro intact with facial features even, able to move tongue appropriately.  Grips equal and moderate.  No swelling noted at surgery site.

## 2015-01-30 DIAGNOSIS — D34 Benign neoplasm of thyroid gland: Secondary | ICD-10-CM | POA: Diagnosis not present

## 2015-01-30 LAB — GLUCOSE, CAPILLARY: GLUCOSE-CAPILLARY: 178 mg/dL — AB (ref 65–99)

## 2015-01-30 LAB — CALCIUM: CALCIUM: 8.3 mg/dL — AB (ref 8.9–10.3)

## 2015-01-30 LAB — MAGNESIUM: Magnesium: 1.7 mg/dL (ref 1.7–2.4)

## 2015-01-30 MED ORDER — DOCUSATE SODIUM 100 MG PO CAPS: 100.0000 mg | ORAL_CAPSULE | Freq: Two times a day (BID) | ORAL | Status: AC

## 2015-01-30 MED ORDER — HYDROCODONE-ACETAMINOPHEN 5-325 MG PO TABS: 1.0000 | ORAL_TABLET | ORAL | Status: AC | PRN

## 2015-01-30 NOTE — Progress Notes (Signed)
Patient ID: Sheena Morgan, female   DOB: 1950-06-29, 64 y.o.   MRN: [id number] Sheena Morgan, Sheena Morgan [id number] 01/07/1951 Riley Nearing, MD   SUBJECTIVE: This 64 y.o. year old female is status post THYROIDECTOMY. *Doing well with good pain control. Eating soft foods this AM. Drain output has reduced overnight. Calcium dropped minimally, now 8.3, but asymptomatic.  Medications:  Current Facility-Administered Medications  Medication Dose Route Frequency Provider Last Rate Last Dose  . bacitracin ointment 1 application  1 application Topical 3 times per day Clyde Canterbury, MD   1 application at 93/90/30 [phone]  . docusate sodium (COLACE) capsule 100 mg  100 mg Oral BID Clyde Canterbury, MD   100 mg at 01/29/15 2211  . HYDROcodone-acetaminophen (NORCO/VICODIN) 5-325 MG per tablet 1-2 tablet  1-2 tablet Oral Q4H PRN Clyde Canterbury, MD   1 tablet at 01/30/15 [phone]  . insulin aspart (novoLOG) injection 0-15 Units  0-15 Units Subcutaneous TID WC Clyde Canterbury, MD   3 Units at 01/30/15 0818  . morphine 2 MG/ML injection 2-4 mg  2-4 mg Intravenous Q2H PRN Clyde Canterbury, MD   2 mg at 01/29/15 1711  . ondansetron (ZOFRAN) tablet 4 mg  4 mg Oral Q4H PRN Clyde Canterbury, MD       Or  . ondansetron [facility]) injection 4 mg  4 mg Intravenous Q4H PRN Clyde Canterbury, MD      .  Medications Prior to Admission  Medication Sig Dispense Refill  . Blood Glucose Monitoring Suppl (ONE TOUCH ULTRA MINI) W/DEVICE KIT Use as directed. Dx code: E11.9    . Calcium Carb-Cholecalciferol (CALCIUM 500+D3 PO) Take 1 tablet by mouth 2 (two) times daily.    Marland Kitchen glipiZIDE (GLUCOTROL) 10 MG tablet Take 10 mg by mouth 2 (two) times daily after a meal.    . hydrochlorothiazide (MICROZIDE) 12.5 MG capsule Take 12.5 mg by mouth daily.    Marland Kitchen latanoprost (XALATAN) 0.005 % ophthalmic solution Place 1 drop into both eyes at bedtime.    . Liraglutide (VICTOZA) 18 MG/3ML SOPN Inject into the skin daily before breakfast.    . losartan (COZAAR) 50 MG tablet Take  50 mg by mouth at bedtime.    Marland Kitchen NITROFURANTOIN MONOHYD MACRO PO Take 100 mg by mouth daily.    . pravastatin (PRAVACHOL) 40 MG tablet Take 40 mg by mouth at bedtime.    . ranitidine (ZANTAC) 300 MG tablet Take 300 mg by mouth 2 (two) times daily.    Marland Kitchen aspirin EC 81 MG tablet Take 81 mg by mouth daily.    . metFORMIN (GLUCOPHAGE) 1000 MG tablet Take 1,000 mg by mouth 2 (two) times daily with a meal.    . Omega-3 Fatty Acids (FISH OIL) 1000 MG CAPS Take by mouth.      OBJECTIVE:  PHYSICAL EXAM  Vitals: Blood pressure 136/59, pulse 99, temperature 98.2 F (36.8 C), temperature source Oral, resp. rate 17, height '5\' 1"'  (1.549 m), weight 63.957 kg (141 lb), SpO2 95 %.. General: Well-developed, Well-nourished in no acute distress Mood: Mood and affect well adjusted, pleasant and cooperative. Orientation: Grossly alert and oriented. Vocal Quality: No hoarseness. Communicates verbally. head and Face: NCAT. No facial asymmetry. No visible skin lesions. No significant facial scars. No tenderness with sinus percussion. Facial strength normal and symmetric. Ears: External ears with normal landmarks, no lesions. External auditory canals free of infection, cerumen impaction or lesions. Tympanic membranes intact with good landmarks and normal mobility on pneumatic otoscopy. No middle ear effusion.  Hearing: Speech reception grossly normal. Nose: External nose normal with midline dorsum and no lesions or deformity. Nasal Cavity reveals essentially midline septum with normal inferior turbinates. No significant mucosal congestion or erythema. Nasal secretions are minimal and clear. No polyps seen on anterior rhinoscopy. Oral Cavity/ Oropharynx: Lips are normal with no lesions. Teeth no frank dental caries. Gingiva healthy with no lesions or gingivitis. Oropharynx including tongue, buccal mucosa, floor of mouth, hard and soft palate, uvula and posterior pharynx free of exudates, erythema or lesions with normal  symmetry and hydration.  Indirect Laryngoscopy/Nasopharyngoscopy: Visualization of the larynx, hypopharynx and nasopharynx is not possible in this setting with routine examination. Neck: Supple and symmetric with no palpable masses, tenderness or crepitance. The trachea is midline. Thyroid gland is soft, nontender and symmetric with no masses or enlargement. Parotid and submandibular glands are soft, nontender and symmetric, without masses. Lymphatic: Cervical lymph nodes are without palpable lymphadenopathy or tenderness. Respiratory: Normal respiratory effort without labored breathing. Cardiovascular: Carotid pulse shows regular rate and rhythm Neurologic: Cranial Nerves II through XII are grossly intact. Eyes: Gaze and Ocular Motility are grossly normal. PERRLA. No visible nystagmus.  MEDICAL DECISION MAKING: Data Review:  Results for orders placed or performed during the hospital encounter of 01/29/15 (from the past 48 hour(s))  Glucose, capillary     Status: Abnormal   Collection Time: 01/29/15  6:08 AM  Result Value Ref Range   Glucose-Capillary 135 (H) 65 - 99 mg/dL   Comment 1 Notify RN   Glucose, capillary     Status: Abnormal   Collection Time: 01/29/15 10:45 AM  Result Value Ref Range   Glucose-Capillary 212 (H) 65 - 99 mg/dL  Glucose, capillary     Status: Abnormal   Collection Time: 01/29/15  1:04 PM  Result Value Ref Range   Glucose-Capillary 195 (H) 65 - 99 mg/dL   Comment 1 Notify RN   Calcium     Status: Abnormal   Collection Time: 01/29/15  3:44 PM  Result Value Ref Range   Calcium 8.6 (L) 8.9 - 10.3 mg/dL  Glucose, capillary     Status: Abnormal   Collection Time: 01/29/15  4:43 PM  Result Value Ref Range   Glucose-Capillary 215 (H) 65 - 99 mg/dL   Comment 1 Notify RN   Glucose, capillary     Status: Abnormal   Collection Time: 01/29/15  9:15 PM  Result Value Ref Range   Glucose-Capillary 176 (H) 65 - 99 mg/dL   Comment 1 Notify RN   Calcium     Status:  Abnormal   Collection Time: 01/30/15  5:48 AM  Result Value Ref Range   Calcium 8.3 (L) 8.9 - 10.3 mg/dL  Magnesium     Status: None   Collection Time: 01/30/15  5:48 AM  Result Value Ref Range   Magnesium 1.7 1.7 - 2.4 mg/dL  Glucose, capillary     Status: Abnormal   Collection Time: 01/30/15  7:32 AM  Result Value Ref Range   Glucose-Capillary 178 (H) 65 - 99 mg/dL   Comment 1 Notify RN   . No results found..    ASSESSMENT: Status post total thyroidectomy doing well with minimal drop in the calcium. Drains are now removed  PLAN: She appears doing well enough to be discharged home. I will increase her DO calcium to 3 times a day and recheck her calcium as an outpatient tomorrow. Recommend ice to the wound to reduce any swelling. Wound care was discussed with  the patient. We will hold off on prescribing Synthroid until we get the results of pathology back. I will see her back in a week for suture removal.   Riley Nearing, MD 01/30/2015 8:30 AM

## 2015-01-31 ENCOUNTER — Other Ambulatory Visit
Admission: RE | Admit: 2015-01-31 | Discharge: 2015-01-31 | Disposition: A | Discharge status: Home / Self Care | Payer: 59 | Source: Ambulatory Visit | Attending: Otolaryngology | Admitting: Otolaryngology

## 2015-01-31 DIAGNOSIS — R5383 Other fatigue: Secondary | ICD-10-CM | POA: Diagnosis not present

## 2015-01-31 LAB — MAGNESIUM: Magnesium: 1.8 mg/dL (ref 1.7–2.4)

## 2015-01-31 LAB — ALBUMIN: ALBUMIN: 4 g/dL (ref 3.5–5.0)

## 2015-01-31 LAB — CALCIUM: Calcium: 9.2 mg/dL (ref 8.9–10.3)

## 2015-02-03 LAB — SURGICAL PATHOLOGY

## 2015-04-24 ENCOUNTER — Ambulatory Visit (INDEPENDENT_AMBULATORY_CARE_PROVIDER_SITE_OTHER): Payer: BLUE CROSS/BLUE SHIELD | Admitting: Obstetrics and Gynecology

## 2015-04-24 ENCOUNTER — Encounter: Payer: Self-pay | Admitting: Obstetrics and Gynecology

## 2015-04-24 VITALS — BP 145/85 | HR 83 | Ht 62.0 in | Wt 142.3 lb

## 2015-04-24 DIAGNOSIS — N951 Menopausal and female climacteric states: Secondary | ICD-10-CM | POA: Diagnosis not present

## 2015-04-24 DIAGNOSIS — N952 Postmenopausal atrophic vaginitis: Secondary | ICD-10-CM

## 2015-04-24 DIAGNOSIS — Z87440 Personal history of urinary (tract) infections: Secondary | ICD-10-CM

## 2015-04-24 DIAGNOSIS — N950 Postmenopausal bleeding: Secondary | ICD-10-CM

## 2015-04-24 NOTE — Progress Notes (Signed)
GYNECOLOGY CLINIC PROGRESS NOTE  Subjective:    Sheena Morgan is a 65 y.o. post-menopausal female who presents for concerns regarding vaginal bleeding. She has been menopausal for 10 years. Has never been on HRT. Patient notes that she was seen at [facility] for h/o recurrent UTI's.  Notes that during an exam, vaginal blood was noted.  Was referred to GYN office.   Bleeding is described as scant staining and has occurred 1 time. Other menopausal symptoms include: vasomotor symptoms began 3 months ago, occur 2 times per day, and last about 4 minutes.. Workup to date: none.  Menstrual History: Menarche age: 21 or 59  No LMP recorded. Patient is postmenopausal.  Denies h/o abnormal pap smears. Last pap smear was ~ 2 years ago.     Past Medical History  Diagnosis Date  . Hypertension   . Diabetes mellitus without complication ([place])   . GERD (gastroesophageal reflux disease)   . Chronic kidney disease     UTI  . Cancer (H[facility])     Skin  . Diverticulosis of colon   . Restless leg syndrome   . Glaucoma (increased eye pressure)   . Vertigo   . Thyroid nodule   . Gastritis   . Colon polyps   . IBS (irritable bowel syndrome)   . Atrophic vaginitis   . 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 UTI (lower urinary tract infection)     Family History  Problem Relation Age of Onset  . Stomach cancer Father   . Heart attack Mother   . Diabetes Mellitus II Mother   . Diabetes Mellitus II Maternal Grandmother   . Kidney disease Father   . Prostate cancer Neg Hx   . Bladder Cancer Neg Hx     Past Surgical History  Procedure Laterality Date  . Cholecystectomy    . Tubal ligation    . Dilation and curettage of uterus    . Thyroidectomy N/A 01/29/2015    Procedure: THYROIDECTOMY/ LARYNGEAL NERVE MONITORING.;  Surgeon: Clyde Canterbury, MD;  Location: [facility] ORS;  Service: ENT;  Laterality: N/A;    Social History   Social History  . Marital Status: Widowed   Spouse Name: N/A  . Number of Children: N/A  . Years of Education: N/A   Occupational History  . Not on file.   Social History Main Topics  . Smoking status: Never Smoker   . Smokeless tobacco: Never Used  . Alcohol Use: Yes     Comment: occ  . Drug Use: No  . Sexual Activity: No   Other Topics Concern  . Not on file   Social History Narrative    Current Outpatient Prescriptions on File Prior to Visit  Medication Sig Dispense Refill  . Calcium Carb-Cholecalciferol (CALCIUM 500+D3 PO) Take 1 tablet by mouth 2 (two) times daily.    Marland Kitchen docusate sodium (COLACE) 100 MG capsule Take 1 capsule (100 mg total) by mouth 2 (two) times daily. 10 capsule 0  . glipiZIDE (GLUCOTROL) 10 MG tablet Take 10 mg by mouth 2 (two) times daily after a meal.    . hydrochlorothiazide (MICROZIDE) 12.5 MG capsule Take 12.5 mg by mouth daily.    Marland Kitchen HYDROcodone-acetaminophen (NORCO/VICODIN) 5-325 MG tablet Take 1-2 tablets by mouth every 4 (four) hours as needed for moderate pain. 30 tablet 0  . latanoprost (XALATAN) 0.005 % ophthalmic solution Place 1 drop into both eyes at bedtime.    . Liraglutide (VICTOZA) 18 MG/3ML SOPN  Inject into the skin daily before breakfast.    . losartan (COZAAR) 50 MG tablet Take 50 mg by mouth at bedtime.    . metFORMIN (GLUCOPHAGE) 1000 MG tablet Take 1,000 mg by mouth 2 (two) times daily with a meal.    . NITROFURANTOIN MONOHYD MACRO PO Take 100 mg by mouth daily.    . Omega-3 Fatty Acids (FISH OIL) 1000 MG CAPS Take by mouth.    . pravastatin (PRAVACHOL) 40 MG tablet Take 40 mg by mouth at bedtime.    . ranitidine (ZANTAC) 300 MG tablet Take 300 mg by mouth 2 (two) times daily.     No current facility-administered medications on file prior to visit.    Allergies  Allergen Reactions  . Lisinopril Cough  . Sulfa Antibiotics Swelling     Review of Systems Pertinent items noted in HPI and remainder of comprehensive ROS otherwise negative.    Objective:    BP 145/85  mmHg  Pulse 83  Ht 5\' 2"  (1.575 m)  Wt 142 lb 4.8 oz (64.547 kg)  BMI 26.02 kg/m2 General appearance: alert and no distress Abdomen: soft, non-tender; bowel sounds normal; no masses,  no organomegaly Pelvic: cervix normal in appearance, external genitalia normal, no adnexal masses or tenderness, no bladder tenderness, no cervical motion tenderness, positive findings: vaginal mucosa atrophic, rectovaginal septum normal and uterus normal size, shape, and consistency Extremities: extremities normal, atraumatic, no cyanosis or edema Skin: Skin color, texture, turgor normal. No rashes or lesions Neurologic: Grossly normal   Assessment:    Postmenopausal bleeding  Vaginal atrophy (moderate) Vasomotor symptoms H/o recurrent UTI's  Plan:    All questions answered. Diagnosis explained in detail, including differential. Differential includes vaginal atrophy, endometrial/cervical polyp, fibroid uterus, and malignancy.  The primary goal in the diagnostic evaluation of postmenopausal women with uterine bleeding is to exclude malignancy; this can include endometrial biopsy and pelvic ultrasound.   Further diagnostic evaluation is indicated for recurrent or persistent bleeding.  Pelvic ultrasound ordered to assess uterine cavity.  If endometrial lining is thickened, Sheena proceed with endometrial biopsy.  RTC in 1-2 weeks after ultrasound. If bleeding due to vaginal atrophy, can discuss treatment options, including local estrogen therapy or vaginal moisturizers, or systemic HRT (due to also experiencing vasomotor symptoms), or can use non-hormonal therapies for vasomotor symptoms.   Rubie Maid, MD [facility]

## 2015-04-28 ENCOUNTER — Other Ambulatory Visit: Payer: Self-pay | Admitting: Physician Assistant

## 2015-04-28 DIAGNOSIS — R10817 Generalized abdominal tenderness: Secondary | ICD-10-CM

## 2015-04-28 DIAGNOSIS — R112 Nausea with vomiting, unspecified: Secondary | ICD-10-CM

## 2015-04-29 ENCOUNTER — Other Ambulatory Visit: Payer: Self-pay | Admitting: Internal Medicine

## 2015-04-29 DIAGNOSIS — Z1231 Encounter for screening mammogram for malignant neoplasm of breast: Secondary | ICD-10-CM

## 2015-05-01 ENCOUNTER — Ambulatory Visit
Admission: RE | Admit: 2015-05-01 | Discharge: 2015-05-01 | Disposition: A | Discharge status: Home / Self Care | Payer: BLUE CROSS/BLUE SHIELD | Source: Ambulatory Visit | Attending: Physician Assistant | Admitting: Physician Assistant

## 2015-05-01 DIAGNOSIS — Z9049 Acquired absence of other specified parts of digestive tract: Secondary | ICD-10-CM | POA: Diagnosis not present

## 2015-05-01 DIAGNOSIS — K760 Fatty (change of) liver, not elsewhere classified: Secondary | ICD-10-CM | POA: Diagnosis not present

## 2015-05-01 DIAGNOSIS — R10817 Generalized abdominal tenderness: Secondary | ICD-10-CM | POA: Insufficient documentation

## 2015-05-01 MED ORDER — IOHEXOL 300 MG/ML  SOLN
100.0000 mL | Freq: Once | INTRAMUSCULAR | Status: AC | PRN
  Administered 2015-05-01: 100 mL via INTRAVENOUS

## 2015-05-07 ENCOUNTER — Ambulatory Visit: Payer: Self-pay

## 2015-05-07 ENCOUNTER — Encounter
Admission: RE | Admit: 2015-05-07 | Discharge: 2015-05-07 | Disposition: A | Discharge status: Home / Self Care | Payer: BLUE CROSS/BLUE SHIELD | Source: Ambulatory Visit | Attending: Physician Assistant | Admitting: Physician Assistant

## 2015-05-07 DIAGNOSIS — R112 Nausea with vomiting, unspecified: Secondary | ICD-10-CM | POA: Insufficient documentation

## 2015-05-07 MED ORDER — TECHNETIUM TC 99M SULFUR COLLOID
2.0800 | Freq: Once | INTRAVENOUS | Status: AC | PRN
  Administered 2015-05-07: 2.08 via ORAL

## 2015-05-08 ENCOUNTER — Ambulatory Visit (INDEPENDENT_AMBULATORY_CARE_PROVIDER_SITE_OTHER): Payer: BLUE CROSS/BLUE SHIELD | Admitting: Obstetrics and Gynecology

## 2015-05-08 ENCOUNTER — Ambulatory Visit (INDEPENDENT_AMBULATORY_CARE_PROVIDER_SITE_OTHER): Payer: BLUE CROSS/BLUE SHIELD

## 2015-05-08 VITALS — BP 123/56 | HR 82 | Ht 62.0 in | Wt 144.9 lb

## 2015-05-08 DIAGNOSIS — N950 Postmenopausal bleeding: Secondary | ICD-10-CM

## 2015-05-08 DIAGNOSIS — N390 Urinary tract infection, site not specified: Secondary | ICD-10-CM

## 2015-05-08 DIAGNOSIS — N951 Menopausal and female climacteric states: Secondary | ICD-10-CM

## 2015-05-08 MED ORDER — ESTROGENS, CONJUGATED 0.625 MG/GM VA CREA: 1.0000 | TOPICAL_CREAM | VAGINAL | Status: AC

## 2015-05-09 ENCOUNTER — Ambulatory Visit
Admission: RE | Admit: 2015-05-09 | Discharge: 2015-05-09 | Disposition: A | Discharge status: Home / Self Care | Payer: BLUE CROSS/BLUE SHIELD | Source: Ambulatory Visit | Attending: Internal Medicine | Admitting: Internal Medicine

## 2015-05-09 DIAGNOSIS — Z1231 Encounter for screening mammogram for malignant neoplasm of breast: Secondary | ICD-10-CM

## 2015-05-12 ENCOUNTER — Encounter: Payer: Self-pay | Admitting: Obstetrics and Gynecology

## 2015-05-12 ENCOUNTER — Other Ambulatory Visit: Payer: Self-pay

## 2015-05-12 NOTE — Progress Notes (Signed)
    GYNECOLOGY PROGRESS NOTE  Subjective:    Patient ID: Sheena Morgan, female    DOB: 1950-08-26, 65 y.o.   MRN: [id number]  HPI  Patient is a 65 y.o.  female who presents for f/u after ultrasound for PMB and h/o recurrent UTIs. Also with occasional vasomotor symptoms.  Denies complaints today.   The following portions of the patient's history were reviewed and updated as appropriate: allergies, current medications, past family history, past medical history, past social history, past surgical history and problem list.   Review of Systems Pertinent items noted in HPI and remainder of comprehensive ROS otherwise negative.   Objective:   Blood pressure 123/56, pulse 82, height 5\' 2"  (1.575 m), weight 144 lb 14.4 oz (65.726 kg). General appearance: alert and no distress Exam deferred.    Pelvic Ultrasound 05/08/2015:    Indications:PMB Findings:  The uterus measures 5.2 x 2.7 x 4.4cm. Echo texture is heterogenous with evidence of focal mass. Within the uterus is a suspected fibroid measuring: Fibroid 1: Left fundus 1.3 x 1.1 x 1.2cm. The Endometrium measures 2.4 mm.  Right Ovary measures 2.1 x 1.4 x 1.5 cm. It is normal in appearance. Left Ovary measures 1.9 x 1.3 x 1.5 cm. It is normal appearance. Survey of the adnexa demonstrates no adnexal masses. There is no free fluid in the cul de sac.  Impression: 1. Fibroid uterus  Recommendations: 1.Clinical correlation with the patient's History and Physical Exam.  Assessment:   PMB H/o recurrent UTI's Vasomotor symptoms   Plan:   1) PMB - likely secondary to vaginal atrophy noted on prior exam last visit.  No evidence of thickened endometrium on today's sono, small fundal fibroid noted, not likely to cause bleeding.  Discussed results with patent. No need for further workup (i.e. Endometrial biopsy). Discussion had on management of atrophy, including hormonal (estrogen cream) vs vaginal moisturizers.  Patient desires  estrogen cream. Given samples of Premarin. Will call in prescription.  2) Recurernt UTIs - likely secondary to vaginal atrophy.  Can be managed with estrogen therapy. Is currrently taking prophylactic long term antibiotics.  3) Vasomotor symptoms - noted ocassionally.  Discussed management options, including behavioral modifications, HRT, non-hormonal options.  Patient notes that she is already performing behavioral modifications, and declines any medications at this time noting that symptoms are infrequent and tolerable.    RTC in 4-6 weeks to f/u atrophy.    A total of 15 minutes were spent face-to-face with the patient during this encounter and over half of that time dealt with counseling and coordination of care.   Rubie Maid, MD [facility]

## 2015-05-13 ENCOUNTER — Encounter
Admission: RE | Admit: 2015-05-13 | Discharge: 2015-05-13 | Disposition: A | Discharge status: Home / Self Care | Payer: BLUE CROSS/BLUE SHIELD | Source: Ambulatory Visit | Attending: Physician Assistant | Admitting: Physician Assistant

## 2015-05-13 DIAGNOSIS — R112 Nausea with vomiting, unspecified: Secondary | ICD-10-CM | POA: Insufficient documentation

## 2015-05-13 MED ORDER — TECHNETIUM TC 99M SULFUR COLLOID
2.0000 | Freq: Once | INTRAVENOUS | Status: AC | PRN
  Administered 2015-05-13: 2.18 via INTRAVENOUS

## 2015-05-28 ENCOUNTER — Encounter: Payer: Self-pay | Admitting: *Deleted

## 2015-05-29 ENCOUNTER — Encounter
Admission: RE | Disposition: A | Discharge status: Home / Self Care | Payer: Self-pay | Source: Ambulatory Visit | Attending: Unknown Physician Specialty

## 2015-05-29 ENCOUNTER — Ambulatory Visit: Payer: BLUE CROSS/BLUE SHIELD | Admitting: Anesthesiology

## 2015-05-29 ENCOUNTER — Ambulatory Visit
Admission: RE | Admit: 2015-05-29 | Discharge: 2015-05-29 | Disposition: A | Discharge status: Home / Self Care | Payer: BLUE CROSS/BLUE SHIELD | Source: Ambulatory Visit | Attending: Unknown Physician Specialty | Admitting: Unknown Physician Specialty

## 2015-05-29 ENCOUNTER — Encounter: Payer: Self-pay | Admitting: *Deleted

## 2015-05-29 DIAGNOSIS — K2980 Duodenitis without bleeding: Secondary | ICD-10-CM | POA: Diagnosis not present

## 2015-05-29 DIAGNOSIS — K219 Gastro-esophageal reflux disease without esophagitis: Secondary | ICD-10-CM | POA: Diagnosis not present

## 2015-05-29 DIAGNOSIS — N189 Chronic kidney disease, unspecified: Secondary | ICD-10-CM | POA: Insufficient documentation

## 2015-05-29 DIAGNOSIS — Z7984 Long term (current) use of oral hypoglycemic drugs: Secondary | ICD-10-CM | POA: Insufficient documentation

## 2015-05-29 DIAGNOSIS — R112 Nausea with vomiting, unspecified: Secondary | ICD-10-CM | POA: Insufficient documentation

## 2015-05-29 DIAGNOSIS — K2950 Unspecified chronic gastritis without bleeding: Secondary | ICD-10-CM | POA: Insufficient documentation

## 2015-05-29 DIAGNOSIS — Z794 Long term (current) use of insulin: Secondary | ICD-10-CM | POA: Insufficient documentation

## 2015-05-29 DIAGNOSIS — H409 Unspecified glaucoma: Secondary | ICD-10-CM | POA: Insufficient documentation

## 2015-05-29 DIAGNOSIS — R1013 Epigastric pain: Secondary | ICD-10-CM | POA: Insufficient documentation

## 2015-05-29 DIAGNOSIS — K589 Irritable bowel syndrome without diarrhea: Secondary | ICD-10-CM | POA: Diagnosis not present

## 2015-05-29 DIAGNOSIS — Z79899 Other long term (current) drug therapy: Secondary | ICD-10-CM | POA: Diagnosis not present

## 2015-05-29 DIAGNOSIS — I129 Hypertensive chronic kidney disease with stage 1 through stage 4 chronic kidney disease, or unspecified chronic kidney disease: Secondary | ICD-10-CM | POA: Insufficient documentation

## 2015-05-29 DIAGNOSIS — Z888 Allergy status to other drugs, medicaments and biological substances status: Secondary | ICD-10-CM | POA: Insufficient documentation

## 2015-05-29 DIAGNOSIS — E039 Hypothyroidism, unspecified: Secondary | ICD-10-CM | POA: Diagnosis not present

## 2015-05-29 DIAGNOSIS — R1084 Generalized abdominal pain: Secondary | ICD-10-CM | POA: Insufficient documentation

## 2015-05-29 DIAGNOSIS — E1122 Type 2 diabetes mellitus with diabetic chronic kidney disease: Secondary | ICD-10-CM | POA: Diagnosis not present

## 2015-05-29 DIAGNOSIS — K449 Diaphragmatic hernia without obstruction or gangrene: Secondary | ICD-10-CM | POA: Insufficient documentation

## 2015-05-29 DIAGNOSIS — G2581 Restless legs syndrome: Secondary | ICD-10-CM | POA: Insufficient documentation

## 2015-05-29 DIAGNOSIS — Z85828 Personal history of other malignant neoplasm of skin: Secondary | ICD-10-CM | POA: Insufficient documentation

## 2015-05-29 DIAGNOSIS — Z86010 Personal history of colonic polyps: Secondary | ICD-10-CM | POA: Insufficient documentation

## 2015-05-29 DIAGNOSIS — Z882 Allergy status to sulfonamides status: Secondary | ICD-10-CM | POA: Insufficient documentation

## 2015-05-29 HISTORY — PX: ESOPHAGOGASTRODUODENOSCOPY (EGD) WITH PROPOFOL: SHX5813

## 2015-05-29 HISTORY — DX: Hypothyroidism, unspecified: E03.9

## 2015-05-29 LAB — GLUCOSE, CAPILLARY: GLUCOSE-CAPILLARY: 267 mg/dL — AB (ref 65–99)

## 2015-05-29 SURGERY — ESOPHAGOGASTRODUODENOSCOPY (EGD) WITH PROPOFOL
Anesthesia: General

## 2015-05-29 MED ORDER — GLYCOPYRROLATE 0.2 MG/ML IJ SOLN
INTRAMUSCULAR | Status: DC | PRN
  Administered 2015-05-29: 0.1 mg via INTRAVENOUS

## 2015-05-29 MED ORDER — PROPOFOL 500 MG/50ML IV EMUL
INTRAVENOUS | Status: DC | PRN
  Administered 2015-05-29: 100 ug/kg/min via INTRAVENOUS

## 2015-05-29 MED ORDER — SODIUM CHLORIDE 0.9 % IV SOLN
INTRAVENOUS | Status: DC
  Administered 2015-05-29: 09:00:00 via INTRAVENOUS

## 2015-05-29 MED ORDER — MIDAZOLAM HCL 2 MG/2ML IJ SOLN
INTRAMUSCULAR | Status: DC | PRN
  Administered 2015-05-29: 1 mg via INTRAVENOUS

## 2015-05-29 MED ORDER — LIDOCAINE HCL (CARDIAC) 20 MG/ML IV SOLN
INTRAVENOUS | Status: DC | PRN
  Administered 2015-05-29: 30 mg via INTRAVENOUS

## 2015-05-29 MED ORDER — SODIUM CHLORIDE 0.9 % IV SOLN: INTRAVENOUS | Status: DC

## 2015-05-29 MED ORDER — FENTANYL CITRATE (PF) 100 MCG/2ML IJ SOLN
INTRAMUSCULAR | Status: DC | PRN
  Administered 2015-05-29: 50 ug via INTRAVENOUS

## 2015-05-29 NOTE — Anesthesia Procedure Notes (Signed)
Performed by: COOK-MARTIN, Martie Muhlbauer Pre-anesthesia Checklist: Patient identified, Emergency Drugs available, Suction available, Patient being monitored and Timeout performed Patient Re-evaluated:Patient Re-evaluated prior to inductionOxygen Delivery Method: Nasal cannula Preoxygenation: Pre-oxygenation with 100% oxygen Intubation Type: IV induction Airway Equipment and Method: Bite block Placement Confirmation: positive ETCO2 and CO2 detector     

## 2015-05-29 NOTE — Transfer of Care (Signed)
Immediate Anesthesia Transfer of Care Note  Patient: Sheena Morgan  Procedure(s) Performed: Procedure(s): ESOPHAGOGASTRODUODENOSCOPY (EGD) WITH PROPOFOL (N/A)  Patient Location: PACU  Anesthesia Type:General  Level of Consciousness: awake and sedated  Airway & Oxygen Therapy: Patient Spontanous Breathing and Patient connected to nasal cannula oxygen  Post-op Assessment: Report given to RN and Post -op Vital signs reviewed and stable  Post vital signs: Reviewed and stable  Last Vitals:  Filed Vitals:   05/29/15 0828  BP: 128/67  Pulse: 89  Temp: 37 C  Resp: 18    Complications: No apparent anesthesia complications

## 2015-05-29 NOTE — Op Note (Signed)
[facility]astroenter[facility] Patient Name: Sheena Morgan Procedure Date: 05/29/2015 9:03 AM MRN: [id number] Account #: [id number] Date of Birth: 1950/12/11 Admit Type: Outpatient Age: 65 Room: [facility] ENDO ROOM 4 Gender: Female Note Status: Finalized Procedure:            Upper GI endoscopy Indications:          Epigastric abdominal pain, Generalized abdominal pain Providers:            Manya Silvas, MD Referring MD:         Tracie Harrier, MD (Referring MD) Medicines:            Propofol per Anesthesia Complications:        No immediate complications. Procedure:            Pre-Anesthesia Assessment:                       - After reviewing the risks and benefits, the patient                        was deemed in satisfactory condition to undergo the                        procedure.                       After obtaining informed consent, the endoscope was                        passed under direct vision. Throughout the procedure,                        the patient's blood pressure, pulse, and oxygen                        saturations were monitored continuously. The Endoscope                        was introduced through the mouth, and advanced to the                        second part of duodenum. The upper GI endoscopy was                        accomplished without difficulty. The patient tolerated                        the procedure well. Findings:      The esophagus was normal. GEJ 38cm.      A very small hiatal hernia was present.      Diffuse and patchy mild inflammation characterized by erythema and       granularity was found in the gastric body. Biopsies were taken with a       cold forceps for histology. Biopsies were taken with a cold forceps for       Helicobacter pylori testing.      The examined duodenum was normal. Impression:           - Normal esophagus.                       - Small hiatal hernia.                       -  Gastritis.  Biopsied.                       - Normal examined duodenum. Recommendation:       - Await pathology results. Take medicine. Manya Silvas, MD 05/29/2015 9:15:12 AM This report has been signed electronically. Number of Addenda: 0 Note Initiated On: 05/29/2015 9:03 AM      [facility]

## 2015-05-29 NOTE — Anesthesia Preprocedure Evaluation (Signed)
Anesthesia Evaluation  Patient identified by MRN, date of birth, ID band Patient awake    Reviewed: Allergy & Precautions, NPO status , Patient's Chart, lab work & pertinent test results  History of Anesthesia Complications Negative for: history of anesthetic complications  Airway Mallampati: II       Dental   Pulmonary neg pulmonary ROS,           Cardiovascular hypertension (pt denies), Pt. on medications      Neuro/Psych negative neurological ROS     GI/Hepatic Neg liver ROS, GERD  Medicated,  Endo/Other  diabetes, Type 2, Insulin Dependent, Oral Hypoglycemic AgentsHypothyroidism   Renal/GU Renal InsufficiencyRenal disease     Musculoskeletal   Abdominal   Peds  Hematology negative hematology ROS (+)   Anesthesia Other Findings   Reproductive/Obstetrics                             Anesthesia Physical Anesthesia Plan  ASA: III  Anesthesia Plan: General   Post-op Pain Management:    Induction: Intravenous  Airway Management Planned: Nasal Cannula  Additional Equipment:   Intra-op Plan:   Post-operative Plan:   Informed Consent: I have reviewed the patients History and Physical, chart, labs and discussed the procedure including the risks, benefits and alternatives for the proposed anesthesia with the patient or authorized representative who has indicated his/her understanding and acceptance.     Plan Discussed with:   Anesthesia Plan Comments:         Anesthesia Quick Evaluation

## 2015-05-29 NOTE — Anesthesia Postprocedure Evaluation (Signed)
Anesthesia Post Note  Patient: Sheena Morgan  Procedure(s) Performed: Procedure(s) (LRB): ESOPHAGOGASTRODUODENOSCOPY (EGD) WITH PROPOFOL (N/A)  Patient location during evaluation: Endoscopy Anesthesia Type: General Level of consciousness: awake and alert Pain management: pain level controlled Vital Signs Assessment: post-procedure vital signs reviewed and stable Respiratory status: spontaneous breathing and respiratory function stable Cardiovascular status: stable Anesthetic complications: no    Last Vitals:  Filed Vitals:   05/29/15 0828 05/29/15 0916  BP: 128/67 115/70  Pulse: 89 95  Temp: 37 C 36 C  Resp: 18 17    Last Pain: There were no vitals filed for this visit.               Venora Kautzman K

## 2015-05-29 NOTE — H&P (Signed)
Primary Care Physician:  Tracie Harrier, MD Primary Gastroenterologist:  Dr. Vira Agar  Pre-Procedure History & Physical: HPI:  Sheena Morgan is a 65 y.o. female is here for an endoscopy.   Past Medical History  Diagnosis Date  . Hypertension   . Diabetes mellitus without complication ([place])   . GERD (gastroesophageal reflux disease)   . Chronic kidney disease     UTI  . Diverticulosis of colon   . Restless leg syndrome   . Glaucoma (increased eye pressure)   . Vertigo   . Thyroid nodule   . Gastritis   . Colon polyps   . IBS (irritable bowel syndrome)   . Atrophic vaginitis   . 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 UTI (lower urinary tract infection)   . Cancer (HCC)     Skin  . Gastritis   . Hypothyroidism     Past Surgical History  Procedure Laterality Date  . Cholecystectomy    . Tubal ligation    . Dilation and curettage of uterus    . Thyroidectomy N/A 01/29/2015    Procedure: THYROIDECTOMY/ LARYNGEAL NERVE MONITORING.;  Surgeon: Clyde Canterbury, MD;  Location: [facility] ORS;  Service: ENT;  Laterality: N/A;    Prior to Admission medications   Medication Sig Start Date End Date Taking? Authorizing Provider  Calcium Carb-Cholecalciferol (CALCIUM 500+D3 PO) Take 1 tablet by mouth 2 (two) times daily.    Historical Provider, MD  conjugated estrogens (PREMARIN) vaginal cream Place 1 Applicatorful vaginally 2 (two) times a week. 05/08/15   Rubie Maid, MD  docusate sodium (COLACE) 100 MG capsule Take 1 capsule (100 mg total) by mouth 2 (two) times daily. 01/30/15   Clyde Canterbury, MD  glipiZIDE (GLUCOTROL) 10 MG tablet Take 10 mg by mouth 2 (two) times daily after a meal.    Historical Provider, MD  hydrochlorothiazide (MICROZIDE) 12.5 MG capsule Take 12.5 mg by mouth daily.    Historical Provider, MD  HYDROcodone-acetaminophen (NORCO/VICODIN) 5-325 MG tablet Take 1-2 tablets by mouth every 4 (four) hours as needed for moderate pain. 01/30/15   Clyde Canterbury, MD  LANTUS  SOLOSTAR 100 UNIT/ML Solostar Pen  05/04/15   Historical Provider, MD  latanoprost (XALATAN) 0.005 % ophthalmic solution Place 1 drop into both eyes at bedtime.    Historical Provider, MD  levothyroxine (SYNTHROID, LEVOTHROID) 112 MCG tablet Take by mouth. 03/12/15   Historical Provider, MD  Liraglutide (VICTOZA) 18 MG/3ML SOPN Inject into the skin daily before breakfast.    Historical Provider, MD  losartan (COZAAR) 50 MG tablet Take 50 mg by mouth at bedtime.    Historical Provider, MD  metFORMIN (GLUCOPHAGE) 1000 MG tablet Take 1,000 mg by mouth 2 (two) times daily with a meal.    Historical Provider, MD  NITROFURANTOIN MONOHYD MACRO PO Take 100 mg by mouth daily.    Historical Provider, MD  Omega-3 Fatty Acids (FISH OIL) 1000 MG CAPS Take by mouth.    Historical Provider, MD  omeprazole (PRILOSEC) 40 MG capsule  04/07/15   Historical Provider, MD  ondansetron (ZOFRAN) 4 MG tablet  03/31/15   Historical Provider, MD  ondansetron (ZOFRAN-ODT) 4 MG disintegrating tablet  04/28/15   Historical Provider, MD  pravastatin (PRAVACHOL) 40 MG tablet Take 40 mg by mouth at bedtime.    Historical Provider, MD  ranitidine (ZANTAC) 300 MG tablet Take 300 mg by mouth 2 (two) times daily.    Historical Provider, MD  sucralfate (CARAFATE) 1 g tablet Take by mouth.  03/31/15 03/30/16  Historical Provider, MD    Allergies as of 05/23/2015 - Review Complete 05/13/2015  Allergen Reaction Noted  . Lisinopril Cough 01/16/2015  . Sulfa antibiotics Swelling 01/16/2015    Family History  Problem Relation Age of Onset  . Stomach cancer Father   . Heart attack Mother   . Diabetes Mellitus II Mother   . Diabetes Mellitus II Maternal Grandmother   . Kidney disease Father   . Prostate cancer Neg Hx   . Bladder Cancer Neg Hx     Social History   Social History  . Marital Status: Widowed    Spouse Name: N/A  . Number of Children: N/A  . Years of Education: N/A   Occupational History  . Not on file.   Social  History Main Topics  . Smoking status: Never Smoker   . Smokeless tobacco: Never Used  . Alcohol Use: Yes     Comment: occ  . Drug Use: No  . Sexual Activity: No   Other Topics Concern  . Not on file   Social History Narrative    Review of Systems: See HPI, otherwise negative ROS  Physical Exam: BP 128/67 mmHg  Pulse 89  Temp(Src) 98.6 F (37 C) (Tympanic)  Resp 18  Ht 5\' 2"  (1.575 m)  Wt 67.132 kg (148 lb)  BMI 27.06 kg/m2  SpO2 99% General:   Alert,  pleasant and cooperative in NAD Head:  Normocephalic and atraumatic. Neck:  Supple; no masses or thyromegaly. Lungs:  Clear throughout to auscultation.    Heart:  Regular rate and rhythm. Abdomen:  Soft, nontender and nondistended. Normal bowel sounds, without guarding, and without rebound.   Neurologic:  Alert and  oriented x4;  grossly normal neurologically.  Impression/Plan: Sheena Morgan is here for an endoscopy to be performed for nausea, vomiting, general abdominal tenderness.  Risks, benefits, limitations, and alternatives regarding  endoscopy have been reviewed with the patient.  Questions have been answered.  All parties agreeable.   Gaylyn Cheers, MD  05/29/2015, 9:01 AM

## 2015-05-30 ENCOUNTER — Encounter: Payer: Self-pay | Admitting: Unknown Physician Specialty

## 2015-06-03 LAB — SURGICAL PATHOLOGY

## 2015-06-26 ENCOUNTER — Other Ambulatory Visit: Payer: Self-pay | Admitting: Physician Assistant

## 2015-06-26 DIAGNOSIS — K760 Fatty (change of) liver, not elsewhere classified: Secondary | ICD-10-CM

## 2015-07-03 ENCOUNTER — Ambulatory Visit
Admission: RE | Admit: 2015-07-03 | Discharge: 2015-07-03 | Disposition: A | Discharge status: Home / Self Care | Payer: BLUE CROSS/BLUE SHIELD | Source: Ambulatory Visit | Attending: Physician Assistant | Admitting: Physician Assistant

## 2015-07-03 DIAGNOSIS — K760 Fatty (change of) liver, not elsewhere classified: Secondary | ICD-10-CM | POA: Diagnosis present

## 2015-07-03 DIAGNOSIS — Z9049 Acquired absence of other specified parts of digestive tract: Secondary | ICD-10-CM | POA: Diagnosis not present

## 2015-10-07 NOTE — Addendum Note (Signed)
Addendum  created 10/07/15 1416 by Allean Found, CRNA   Sign clinical note

## 2016-02-06 ENCOUNTER — Ambulatory Visit
Admission: RE | Admit: 2016-02-06 | Discharge: 2016-02-06 | Disposition: A | Discharge status: Home / Self Care | Payer: BLUE CROSS/BLUE SHIELD | Source: Ambulatory Visit | Attending: Unknown Physician Specialty | Admitting: Unknown Physician Specialty

## 2016-02-06 ENCOUNTER — Ambulatory Visit: Payer: BLUE CROSS/BLUE SHIELD | Admitting: Anesthesiology

## 2016-02-06 ENCOUNTER — Encounter
Admission: RE | Disposition: A | Discharge status: Home / Self Care | Payer: Self-pay | Source: Ambulatory Visit | Attending: Unknown Physician Specialty

## 2016-02-06 DIAGNOSIS — Z79899 Other long term (current) drug therapy: Secondary | ICD-10-CM | POA: Diagnosis not present

## 2016-02-06 DIAGNOSIS — E1122 Type 2 diabetes mellitus with diabetic chronic kidney disease: Secondary | ICD-10-CM | POA: Diagnosis not present

## 2016-02-06 DIAGNOSIS — N189 Chronic kidney disease, unspecified: Secondary | ICD-10-CM | POA: Insufficient documentation

## 2016-02-06 DIAGNOSIS — D122 Benign neoplasm of ascending colon: Secondary | ICD-10-CM | POA: Diagnosis not present

## 2016-02-06 DIAGNOSIS — Z7984 Long term (current) use of oral hypoglycemic drugs: Secondary | ICD-10-CM | POA: Insufficient documentation

## 2016-02-06 DIAGNOSIS — K5730 Diverticulosis of large intestine without perforation or abscess without bleeding: Secondary | ICD-10-CM | POA: Insufficient documentation

## 2016-02-06 DIAGNOSIS — Z1211 Encounter for screening for malignant neoplasm of colon: Secondary | ICD-10-CM | POA: Insufficient documentation

## 2016-02-06 DIAGNOSIS — G2581 Restless legs syndrome: Secondary | ICD-10-CM | POA: Diagnosis not present

## 2016-02-06 DIAGNOSIS — H409 Unspecified glaucoma: Secondary | ICD-10-CM | POA: Insufficient documentation

## 2016-02-06 DIAGNOSIS — K219 Gastro-esophageal reflux disease without esophagitis: Secondary | ICD-10-CM | POA: Insufficient documentation

## 2016-02-06 DIAGNOSIS — K640 First degree hemorrhoids: Secondary | ICD-10-CM | POA: Insufficient documentation

## 2016-02-06 DIAGNOSIS — E039 Hypothyroidism, unspecified: Secondary | ICD-10-CM | POA: Diagnosis not present

## 2016-02-06 DIAGNOSIS — Z7982 Long term (current) use of aspirin: Secondary | ICD-10-CM | POA: Insufficient documentation

## 2016-02-06 DIAGNOSIS — I129 Hypertensive chronic kidney disease with stage 1 through stage 4 chronic kidney disease, or unspecified chronic kidney disease: Secondary | ICD-10-CM | POA: Diagnosis not present

## 2016-02-06 DIAGNOSIS — Z86010 Personal history of colonic polyps: Secondary | ICD-10-CM | POA: Diagnosis not present

## 2016-02-06 DIAGNOSIS — D123 Benign neoplasm of transverse colon: Secondary | ICD-10-CM | POA: Insufficient documentation

## 2016-02-06 DIAGNOSIS — K589 Irritable bowel syndrome without diarrhea: Secondary | ICD-10-CM | POA: Insufficient documentation

## 2016-02-06 HISTORY — PX: COLONOSCOPY WITH PROPOFOL: SHX5780

## 2016-02-06 LAB — GLUCOSE, CAPILLARY: GLUCOSE-CAPILLARY: 161 mg/dL — AB (ref 65–99)

## 2016-02-06 SURGERY — COLONOSCOPY WITH PROPOFOL
Anesthesia: General

## 2016-02-06 MED ORDER — SODIUM CHLORIDE 0.9 % IV SOLN
INTRAVENOUS | Status: DC
  Administered 2016-02-06: 08:00:00 via INTRAVENOUS

## 2016-02-06 MED ORDER — MIDAZOLAM HCL 2 MG/2ML IJ SOLN
INTRAMUSCULAR | Status: DC | PRN
  Administered 2016-02-06: 1 mg via INTRAVENOUS

## 2016-02-06 MED ORDER — SODIUM CHLORIDE 0.9 % IV SOLN: INTRAVENOUS | Status: DC

## 2016-02-06 MED ORDER — EPHEDRINE SULFATE 50 MG/ML IJ SOLN
INTRAMUSCULAR | Status: DC | PRN
  Administered 2016-02-06 (×2): 5 mg via INTRAVENOUS

## 2016-02-06 MED ORDER — PROPOFOL 500 MG/50ML IV EMUL
INTRAVENOUS | Status: DC | PRN
  Administered 2016-02-06: 120 ug/kg/min via INTRAVENOUS

## 2016-02-06 MED ORDER — FENTANYL CITRATE (PF) 100 MCG/2ML IJ SOLN
INTRAMUSCULAR | Status: DC | PRN
  Administered 2016-02-06: 50 ug via INTRAVENOUS

## 2016-02-06 NOTE — Anesthesia Procedure Notes (Signed)
Performed by: COOK-MARTIN, Shatia Sindoni Pre-anesthesia Checklist: Patient identified, Emergency Drugs available, Suction available, Patient being monitored and Timeout performed Patient Re-evaluated:Patient Re-evaluated prior to inductionOxygen Delivery Method: Nasal cannula Preoxygenation: Pre-oxygenation with 100% oxygen Intubation Type: IV induction Placement Confirmation: positive ETCO2 and CO2 detector       

## 2016-02-06 NOTE — Anesthesia Postprocedure Evaluation (Signed)
Anesthesia Post Note  Patient: Sheena Morgan  Procedure(s) Performed: Procedure(s) (LRB): COLONOSCOPY WITH PROPOFOL (N/A)  Patient location during evaluation: Endoscopy Anesthesia Type: General Level of consciousness: awake and alert and oriented Pain management: pain level controlled Vital Signs Assessment: post-procedure vital signs reviewed and stable Respiratory status: spontaneous breathing, nonlabored ventilation and respiratory function stable Cardiovascular status: blood pressure returned to baseline and stable Postop Assessment: no signs of nausea or vomiting Anesthetic complications: no    Last Vitals:  Vitals:   02/06/16 0856 02/06/16 0906  BP: (!) 107/49 (!) 117/55  Pulse: 86 79  Resp: 18 16  Temp:      Last Pain:  Vitals:   02/06/16 0803  TempSrc: Tympanic                 Ace Bergfeld

## 2016-02-06 NOTE — Anesthesia Preprocedure Evaluation (Addendum)
Anesthesia Evaluation  Patient identified by MRN, date of birth, ID band Patient awake    Reviewed: Allergy & Precautions, NPO status , Patient's Chart, lab work & pertinent test results  History of Anesthesia Complications Negative for: history of anesthetic complications  Airway Mallampati: II  TM Distance: >3 FB Neck ROM: Full    Dental no notable dental hx.    Pulmonary neg pulmonary ROS, neg sleep apnea, neg COPD,    breath sounds clear to auscultation- rhonchi (-) wheezing      Cardiovascular Exercise Tolerance: Good hypertension, Pt. on medications (-) CAD and (-) Past MI  Rhythm:Regular Rate:Normal - Systolic murmurs and - Diastolic murmurs    Neuro/Psych negative neurological ROS  negative psych ROS   GI/Hepatic GERD  ,  Endo/Other  diabetes, Type 2, Oral Hypoglycemic Agents, Insulin DependentHypothyroidism   Renal/GU Renal disease     Musculoskeletal negative musculoskeletal ROS (+)   Abdominal (+) - obese,   Peds  Hematology negative hematology ROS (+)   Anesthesia Other Findings   Reproductive/Obstetrics                             Anesthesia Physical Anesthesia Plan  ASA: III  Anesthesia Plan: General   Post-op Pain Management:    Induction: Intravenous  Airway Management Planned: Natural Airway  Additional Equipment:   Intra-op Plan:   Post-operative Plan:   Informed Consent: I have reviewed the patients History and Physical, chart, labs and discussed the procedure including the risks, benefits and alternatives for the proposed anesthesia with the patient or authorized representative who has indicated his/her understanding and acceptance.   Dental advisory given  Plan Discussed with: CRNA and Anesthesiologist  Anesthesia Plan Comments:         Anesthesia Quick Evaluation

## 2016-02-06 NOTE — H&P (Signed)
Primary Care Physician:  Tracie Harrier, MD Primary Gastroenterologist:  Dr. Vira Agar  Pre-Procedure History & Physical: HPI:  Sheena Morgan is a 65 y.o. female is here for an colonoscopy.   Past Medical History:  Diagnosis Date  . Atrophic vaginitis   . Cancer (HCC)    Skin  . Chronic kidney disease    UTI  . Colon polyps   . Diabetes mellitus without complication ([place])   . Diverticulosis of colon   . Gastritis   . Gastritis   . GERD (gastroesophageal reflux disease)   . Glaucoma (increased eye pressure)   . Hypertension   . Hypothyroidism   . IBS (irritable bowel syndrome)   . 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 Restless leg syndrome   . Thyroid nodule   . UTI (lower urinary tract infection)   . Vertigo     Past Surgical History:  Procedure Laterality Date  . CHOLECYSTECTOMY    . DILATION AND CURETTAGE OF UTERUS    . ESOPHAGOGASTRODUODENOSCOPY (EGD) WITH PROPOFOL N/A 05/29/2015   Procedure: ESOPHAGOGASTRODUODENOSCOPY (EGD) WITH PROPOFOL;  Surgeon: Manya Silvas, MD;  Location: [facility];  Service: Endoscopy;  Laterality: N/A;  . THYROIDECTOMY N/A 01/29/2015   Procedure: THYROIDECTOMY/ LARYNGEAL NERVE MONITORING.;  Surgeon: Clyde Canterbury, MD;  Location: [facility] ORS;  Service: ENT;  Laterality: N/A;  . TUBAL LIGATION      Prior to Admission medications   Medication Sig Start Date End Date Taking? Authorizing Provider  aspirin EC 81 MG tablet Take 81 mg by mouth daily.   Yes Historical Provider, MD  Calcium Carb-Cholecalciferol (CALCIUM 500+D3 PO) Take 1 tablet by mouth 2 (two) times daily.   Yes Historical Provider, MD  hydrochlorothiazide (MICROZIDE) 12.5 MG capsule Take 12.5 mg by mouth daily.   Yes Historical Provider, MD  HYDROcodone-acetaminophen (NORCO/VICODIN) 5-325 MG tablet Take 1-2 tablets by mouth every 4 (four) hours as needed for moderate pain. 01/30/15  Yes Clyde Canterbury, MD  insulin aspart (NOVOLOG) 100 UNIT/ML injection Inject 20 Units into  the skin 2 (two) times daily before a meal.   Yes Historical Provider, MD  levothyroxine (SYNTHROID, LEVOTHROID) 112 MCG tablet Take by mouth. 03/12/15  Yes Historical Provider, MD  Liraglutide (VICTOZA) 18 MG/3ML SOPN Inject into the skin daily before breakfast.   Yes Historical Provider, MD  metFORMIN (GLUCOPHAGE) 1000 MG tablet Take 1,000 mg by mouth 2 (two) times daily with a meal.   Yes Historical Provider, MD  NITROFURANTOIN MONOHYD MACRO PO Take 100 mg by mouth daily.   Yes Historical Provider, MD  Omega-3 Fatty Acids (FISH OIL) 1000 MG CAPS Take by mouth.   Yes Historical Provider, MD  omeprazole (PRILOSEC) 40 MG capsule  04/07/15  Yes Historical Provider, MD  ranitidine (ZANTAC) 300 MG tablet Take 300 mg by mouth 2 (two) times daily.   Yes Historical Provider, MD  conjugated estrogens (PREMARIN) vaginal cream Place 1 Applicatorful vaginally 2 (two) times a week. 05/08/15   Rubie Maid, MD  docusate sodium (COLACE) 100 MG capsule Take 1 capsule (100 mg total) by mouth 2 (two) times daily. Patient not taking: Reported on 02/06/2016 01/30/15   Clyde Canterbury, MD  glipiZIDE (GLUCOTROL) 10 MG tablet Take 10 mg by mouth 2 (two) times daily after a meal.    Historical Provider, MD  LANTUS SOLOSTAR 100 UNIT/ML Solostar Pen  05/04/15   Historical Provider, MD  latanoprost (XALATAN) 0.005 % ophthalmic solution Place 1 drop into both eyes at bedtime.  Historical Provider, MD  losartan (COZAAR) 50 MG tablet Take 50 mg by mouth at bedtime.    Historical Provider, MD  ondansetron (ZOFRAN) 4 MG tablet  03/31/15   Historical Provider, MD  ondansetron (ZOFRAN-ODT) 4 MG disintegrating tablet  04/28/15   Historical Provider, MD  pravastatin (PRAVACHOL) 40 MG tablet Take 40 mg by mouth at bedtime.    Historical Provider, MD  sucralfate (CARAFATE) 1 g tablet Take by mouth. 03/31/15 03/30/16  Historical Provider, MD    Allergies as of 01/06/2016 - Review Complete 05/29/2015  Allergen Reaction Noted  . Lisinopril Cough  01/16/2015  . Sulfa antibiotics Swelling 01/16/2015    Family History  Problem Relation Age of Onset  . Heart attack Mother   . Diabetes Mellitus II Mother   . Stomach cancer Father   . Kidney disease Father   . Diabetes Mellitus II Maternal Grandmother   . Prostate cancer Neg Hx   . Bladder Cancer Neg Hx     Social History   Social History  . Marital status: Widowed    Spouse name: N/A  . Number of children: N/A  . Years of education: N/A   Occupational History  . Not on file.   Social History Main Topics  . Smoking status: Never Smoker  . Smokeless tobacco: Never Used  . Alcohol use No  . Drug use: No  . Sexual activity: No   Other Topics Concern  . Not on file   Social History Narrative  . No narrative on file    Review of Systems: See HPI, otherwise negative ROS  Physical Exam: BP 134/74   Pulse 75   Temp 97.1 F (36.2 C) (Tympanic)   Resp 18   Ht 5' 2.5" (1.588 m)   Wt 68 kg (150 lb)   SpO2 100%   BMI 27.00 kg/m  General:   Alert,  pleasant and cooperative in NAD Head:  Normocephalic and atraumatic. Neck:  Supple; no masses or thyromegaly. Lungs:  Clear throughout to auscultation.    Heart:  Regular rate and rhythm. Abdomen:  Soft, nontender and nondistended. Normal bowel sounds, without guarding, and without rebound.   Neurologic:  Alert and  oriented x4;  grossly normal neurologically.  Impression/Plan: Sheena Morgan is here for an colonoscopy to be performed for [facility] colon polyps  Risks, benefits, limitations, and alternatives regarding  colonoscopy have been reviewed with the patient.  Questions have been answered.  All parties agreeable.   Gaylyn Cheers, MD  02/06/2016, 8:18 AM

## 2016-02-06 NOTE — Op Note (Signed)
[facility] Gastroenterology Patient Name: Sheena Morgan Procedure Date: 02/06/2016 8:21 AM MRN: [id number] Account #: [id number] Date of Birth: 17-Jan-1951 Admit Type: Outpatient Age: 65 Room: [facility] ENDO ROOM 4 Gender: Female Note Status: Finalized Procedure:            Colonoscopy Indications:          High risk colon cancer surveillance: Personal history                        of colonic polyps Providers:            Manya Silvas, MD Referring MD:         Tracie Harrier, MD (Referring MD) Medicines:            Propofol per Anesthesia Complications:        No immediate complications. Procedure:            Pre-Anesthesia Assessment:                       - After reviewing the risks and benefits, the patient                        was deemed in satisfactory condition to undergo the                        procedure.                       After obtaining informed consent, the colonoscope was                        passed under direct vision. Throughout the procedure,                        the patient's blood pressure, pulse, and oxygen                        saturations were monitored continuously. The                        Colonoscope was introduced through the anus and                        advanced to the the cecum, identified by appendiceal                        orifice and ileocecal valve. The colonoscopy was                        performed without difficulty. The patient tolerated the                        procedure well. The quality of the bowel preparation                        was excellent. Findings:      Three sessile polyps were found in the recto-sigmoid colon, splenic       flexure and hepatic flexure. The polyps were small in size. These polyps       were removed with a hot snare. Resection and retrieval were complete.  Many medium-mouthed diverticula were found in the sigmoid colon,       descending colon, transverse colon and  ascending colon.      Internal hemorrhoids were found during endoscopy. The hemorrhoids were       small and Grade I (internal hemorrhoids that do not prolapse).      The exam was otherwise without abnormality. Impression:           - Three small polyps at the recto-sigmoid colon, at the                        splenic flexure and at the hepatic flexure, removed                        with a hot snare. Resected and retrieved.                       - Diverticulosis in the sigmoid colon, in the                        descending colon, in the transverse colon and in the                        ascending colon.                       - Internal hemorrhoids.                       - The examination was otherwise normal. Recommendation:       - Await pathology results. Manya Silvas, MD 02/06/2016 8:53:53 AM This report has been signed electronically. Number of Addenda: 0 Note Initiated On: 02/06/2016 8:21 AM Scope Withdrawal Time: 0 hours 16 minutes 28 seconds  Total Procedure Duration: 0 hours 24 minutes 48 seconds       [facility]

## 2016-02-06 NOTE — Transfer of Care (Signed)
Immediate Anesthesia Transfer of Care Note  Patient: Sheena Morgan  Procedure(s) Performed: Procedure(s): COLONOSCOPY WITH PROPOFOL (N/A)  Patient Location: PACU  Anesthesia Type:General  Level of Consciousness: awake, alert  and sedated  Airway & Oxygen Therapy: Patient Spontanous Breathing and Patient connected to nasal cannula oxygen  Post-op Assessment: Report given to RN and Post -op Vital signs reviewed and stable  Post vital signs: Reviewed and stable  Last Vitals:  Vitals:   02/06/16 0803  BP: 134/74  Pulse: 75  Resp: 18  Temp: 36.2 C    Last Pain:  Vitals:   02/06/16 0803  TempSrc: Tympanic         Complications: No apparent anesthesia complications

## 2016-02-09 ENCOUNTER — Encounter: Payer: Self-pay | Admitting: Unknown Physician Specialty

## 2016-02-10 LAB — SURGICAL PATHOLOGY

## 2016-04-09 ENCOUNTER — Other Ambulatory Visit: Payer: Self-pay | Admitting: Student

## 2016-04-09 DIAGNOSIS — K760 Fatty (change of) liver, not elsewhere classified: Secondary | ICD-10-CM

## 2016-04-16 ENCOUNTER — Ambulatory Visit
Admission: RE | Admit: 2016-04-16 | Discharge: 2016-04-16 | Disposition: A | Discharge status: Home / Self Care | Payer: BLUE CROSS/BLUE SHIELD | Source: Ambulatory Visit | Attending: Student | Admitting: Student

## 2016-04-16 DIAGNOSIS — K838 Other specified diseases of biliary tract: Secondary | ICD-10-CM | POA: Insufficient documentation

## 2016-04-16 DIAGNOSIS — Z9049 Acquired absence of other specified parts of digestive tract: Secondary | ICD-10-CM | POA: Insufficient documentation

## 2016-04-16 DIAGNOSIS — K760 Fatty (change of) liver, not elsewhere classified: Secondary | ICD-10-CM | POA: Insufficient documentation

## 2016-05-06 ENCOUNTER — Other Ambulatory Visit: Payer: Self-pay | Admitting: Internal Medicine

## 2016-05-06 DIAGNOSIS — Z1231 Encounter for screening mammogram for malignant neoplasm of breast: Secondary | ICD-10-CM

## 2016-06-24 ENCOUNTER — Ambulatory Visit
Admission: RE | Admit: 2016-06-24 | Discharge: 2016-06-24 | Disposition: A | Discharge status: Home / Self Care | Payer: Medicare Other | Source: Ambulatory Visit | Attending: Internal Medicine | Admitting: Internal Medicine

## 2016-06-24 DIAGNOSIS — Z1231 Encounter for screening mammogram for malignant neoplasm of breast: Secondary | ICD-10-CM

## 2016-12-25 ENCOUNTER — Emergency Department
Admission: EM | Admit: 2016-12-25 | Discharge: 2016-12-25 | Disposition: A | Discharge status: Home / Self Care | Payer: Medicare Other | Attending: Emergency Medicine | Admitting: Emergency Medicine

## 2016-12-25 ENCOUNTER — Emergency Department: Payer: Medicare Other

## 2016-12-25 ENCOUNTER — Encounter: Payer: Self-pay | Admitting: Emergency Medicine

## 2016-12-25 DIAGNOSIS — Y999 Unspecified external cause status: Secondary | ICD-10-CM | POA: Diagnosis not present

## 2016-12-25 DIAGNOSIS — Z7982 Long term (current) use of aspirin: Secondary | ICD-10-CM | POA: Diagnosis not present

## 2016-12-25 DIAGNOSIS — Y9289 Other specified places as the place of occurrence of the external cause: Secondary | ICD-10-CM | POA: Insufficient documentation

## 2016-12-25 DIAGNOSIS — S82124A Nondisplaced fracture of lateral condyle of right tibia, initial encounter for closed fracture: Secondary | ICD-10-CM | POA: Diagnosis not present

## 2016-12-25 DIAGNOSIS — E039 Hypothyroidism, unspecified: Secondary | ICD-10-CM | POA: Diagnosis not present

## 2016-12-25 DIAGNOSIS — S8991XA Unspecified injury of right lower leg, initial encounter: Secondary | ICD-10-CM | POA: Diagnosis present

## 2016-12-25 DIAGNOSIS — X501XXA Overexertion from prolonged static or awkward postures, initial encounter: Secondary | ICD-10-CM | POA: Diagnosis not present

## 2016-12-25 DIAGNOSIS — Z794 Long term (current) use of insulin: Secondary | ICD-10-CM | POA: Diagnosis not present

## 2016-12-25 DIAGNOSIS — Z79899 Other long term (current) drug therapy: Secondary | ICD-10-CM | POA: Insufficient documentation

## 2016-12-25 DIAGNOSIS — I10 Essential (primary) hypertension: Secondary | ICD-10-CM | POA: Insufficient documentation

## 2016-12-25 DIAGNOSIS — Y9389 Activity, other specified: Secondary | ICD-10-CM | POA: Insufficient documentation

## 2016-12-25 DIAGNOSIS — E119 Type 2 diabetes mellitus without complications: Secondary | ICD-10-CM | POA: Diagnosis not present

## 2016-12-25 DIAGNOSIS — S82141A Displaced bicondylar fracture of right tibia, initial encounter for closed fracture: Secondary | ICD-10-CM

## 2016-12-25 MED ORDER — OXYCODONE-ACETAMINOPHEN 5-325 MG PO TABS
1.0000 | ORAL_TABLET | Freq: Once | ORAL | Status: AC
  Administered 2016-12-25: 1 via ORAL
  Filled 2016-12-25: qty 1

## 2016-12-25 MED ORDER — OXYCODONE-ACETAMINOPHEN 5-325 MG PO TABS: 1.0000 | ORAL_TABLET | Freq: Four times a day (QID) | ORAL | 0 refills | Status: AC | PRN

## 2016-12-25 NOTE — ED Notes (Signed)
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
## 2016-12-25 NOTE — ED Triage Notes (Signed)
Patient was at church function tonight and was exerting herself when she felt her right knee pop, causing 10/10 "achy" quality pain.  Patient is in NAD at this time and VS WNL.

## 2017-06-10 ENCOUNTER — Other Ambulatory Visit: Payer: Self-pay | Admitting: Internal Medicine

## 2017-06-10 DIAGNOSIS — Z1231 Encounter for screening mammogram for malignant neoplasm of breast: Secondary | ICD-10-CM

## 2017-06-23 ENCOUNTER — Ambulatory Visit
Admission: RE | Admit: 2017-06-23 | Discharge: 2017-06-23 | Disposition: A | Discharge status: Home / Self Care | Payer: Medicare Other | Source: Ambulatory Visit | Attending: Internal Medicine | Admitting: Internal Medicine

## 2017-06-23 DIAGNOSIS — Z1231 Encounter for screening mammogram for malignant neoplasm of breast: Secondary | ICD-10-CM | POA: Insufficient documentation

## 2018-07-12 ENCOUNTER — Other Ambulatory Visit: Payer: Self-pay | Admitting: Internal Medicine

## 2018-07-12 DIAGNOSIS — Z1231 Encounter for screening mammogram for malignant neoplasm of breast: Secondary | ICD-10-CM

## 2019-02-01 ENCOUNTER — Ambulatory Visit
Admission: RE | Admit: 2019-02-01 | Discharge: 2019-02-01 | Disposition: A | Discharge status: Home / Self Care | Payer: Medicare Other | Source: Ambulatory Visit | Attending: Internal Medicine | Admitting: Internal Medicine

## 2019-02-01 DIAGNOSIS — Z1231 Encounter for screening mammogram for malignant neoplasm of breast: Secondary | ICD-10-CM | POA: Diagnosis not present

## 2019-06-13 ENCOUNTER — Encounter: Payer: Self-pay | Admitting: *Deleted

## 2019-06-13 ENCOUNTER — Emergency Department
Admission: EM | Admit: 2019-06-13 | Discharge: 2019-06-13 | Disposition: A | Discharge status: Home / Self Care | Payer: Medicare Other | Attending: Emergency Medicine | Admitting: Emergency Medicine

## 2019-06-13 ENCOUNTER — Other Ambulatory Visit: Payer: Self-pay

## 2019-06-13 ENCOUNTER — Emergency Department: Payer: Medicare Other

## 2019-06-13 DIAGNOSIS — Y999 Unspecified external cause status: Secondary | ICD-10-CM | POA: Insufficient documentation

## 2019-06-13 DIAGNOSIS — Y93I9 Activity, other involving external motion: Secondary | ICD-10-CM | POA: Diagnosis not present

## 2019-06-13 DIAGNOSIS — R911 Solitary pulmonary nodule: Secondary | ICD-10-CM

## 2019-06-13 DIAGNOSIS — Z79899 Other long term (current) drug therapy: Secondary | ICD-10-CM | POA: Diagnosis not present

## 2019-06-13 DIAGNOSIS — Z7982 Long term (current) use of aspirin: Secondary | ICD-10-CM | POA: Insufficient documentation

## 2019-06-13 DIAGNOSIS — E119 Type 2 diabetes mellitus without complications: Secondary | ICD-10-CM | POA: Insufficient documentation

## 2019-06-13 DIAGNOSIS — R0789 Other chest pain: Secondary | ICD-10-CM | POA: Insufficient documentation

## 2019-06-13 DIAGNOSIS — E039 Hypothyroidism, unspecified: Secondary | ICD-10-CM | POA: Diagnosis not present

## 2019-06-13 DIAGNOSIS — Y92410 Unspecified street and highway as the place of occurrence of the external cause: Secondary | ICD-10-CM | POA: Diagnosis not present

## 2019-06-13 DIAGNOSIS — Z794 Long term (current) use of insulin: Secondary | ICD-10-CM | POA: Insufficient documentation

## 2019-06-13 DIAGNOSIS — I10 Essential (primary) hypertension: Secondary | ICD-10-CM | POA: Insufficient documentation

## 2019-06-13 LAB — TROPONIN I (HIGH SENSITIVITY): Troponin I (High Sensitivity): 3 ng/L (ref <18)

## 2019-06-13 LAB — BASIC METABOLIC PANEL
Anion gap: 11 (ref 5–15)
BUN: 12 mg/dL (ref 8–23)
CO2: 23 mmol/L (ref 22–32)
Calcium: 9 mg/dL (ref 8.9–10.3)
Chloride: 102 mmol/L (ref 98–111)
Creatinine, Ser: 0.69 mg/dL (ref 0.44–1.00)
GFR calc Af Amer: >60 mL/min (ref >60)
GFR calc non Af Amer: >60 mL/min (ref >60)
Glucose, Bld: 111 mg/dL — ABNORMAL HIGH (ref 70–99)
Potassium: 3.4 mmol/L — ABNORMAL LOW (ref 3.5–5.1)
Sodium: 136 mmol/L (ref 135–145)

## 2019-06-13 LAB — CBC
HCT: 36.2 % (ref 36.0–46.0)
Hemoglobin: 12.7 g/dL (ref 12.0–15.0)
MCH: 30.2 pg (ref 26.0–34.0)
MCHC: 35.1 g/dL (ref 30.0–36.0)
MCV: 86 fL (ref 80.0–100.0)
Platelets: 263 10*3/uL (ref 150–400)
RBC: 4.21 MIL/uL (ref 3.87–5.11)
RDW: 12 % (ref 11.5–15.5)
WBC: 5.6 10*3/uL (ref 4.0–10.5)
nRBC: 0 % (ref 0.0–0.2)

## 2019-06-13 MED ORDER — NAPROXEN 250 MG PO TABS: 250.0000 mg | ORAL_TABLET | Freq: Two times a day (BID) | ORAL | 0 refills | Status: AC

## 2019-06-13 MED ORDER — SODIUM CHLORIDE 0.9% FLUSH: 3.0000 mL | Freq: Once | INTRAVENOUS | Status: DC

## 2019-06-13 MED ORDER — KETOROLAC TROMETHAMINE 60 MG/2ML IM SOLN
30.0000 mg | Freq: Once | INTRAMUSCULAR | Status: AC
  Administered 2019-06-13: 30 mg via INTRAMUSCULAR
  Filled 2019-06-13: qty 2

## 2019-06-13 MED ORDER — OXYCODONE HCL 5 MG PO TABS: 2.5000 mg | ORAL_TABLET | Freq: Four times a day (QID) | ORAL | 0 refills | Status: DC | PRN

## 2019-06-13 MED ORDER — NAPROXEN 250 MG PO TABS: 250.0000 mg | ORAL_TABLET | Freq: Two times a day (BID) | ORAL | 0 refills | Status: DC

## 2019-06-13 MED ORDER — KETOROLAC TROMETHAMINE 60 MG/2ML IM SOLN: 60.0000 mg | Freq: Once | INTRAMUSCULAR | Status: DC

## 2019-06-13 MED ORDER — HYDROCODONE-ACETAMINOPHEN 5-325 MG PO TABS
2.0000 | ORAL_TABLET | Freq: Once | ORAL | Status: AC
Start: 2019-06-13 — End: 2019-06-13
  Administered 2019-06-13: 20:00:00 2 via ORAL
  Filled 2019-06-13: qty 2

## 2019-06-13 MED ORDER — ONDANSETRON 4 MG PO TBDP
4.0000 mg | ORAL_TABLET | Freq: Once | ORAL | Status: AC
  Administered 2019-06-13: 4 mg via ORAL
  Filled 2019-06-13: qty 1

## 2019-06-13 MED ORDER — DOCUSATE SODIUM 100 MG PO CAPS: 100.0000 mg | ORAL_CAPSULE | Freq: Every day | ORAL | 0 refills | Status: DC

## 2019-06-13 MED ORDER — DOCUSATE SODIUM 100 MG PO CAPS: 100.0000 mg | ORAL_CAPSULE | Freq: Every day | ORAL | 0 refills | Status: AC

## 2019-06-13 MED ORDER — OXYCODONE HCL 5 MG PO TABS: 2.5000 mg | ORAL_TABLET | Freq: Four times a day (QID) | ORAL | 0 refills | Status: AC | PRN

## 2019-06-13 NOTE — Discharge Instructions (Addendum)
For your pain:  Take Tylenol 500 to 1000 mg every 6 hours for the next 3 days, then as needed for mild pain.  Take this even if you are not hurting severely over the next 3 days.  For moderate to severe pain, in addition to the Tylenol, take the oxycodone 1/2 tablet or 1 full tablet every 6 hours as needed.  Take this with food.  When taking this, I also recommend a stool softener such as Colace.  Take the naproxen 250 mg twice a day with food for the next 3 days, then as needed for pain.  You can use a soft pillow held to the chest to help with pain.  Follow-up with your regular doctor in the next several days.  No heavy lifting greater than 10 pounds for the next week.
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
## 2019-06-13 NOTE — ED Notes (Signed)
Pt transported to Ct.

## 2019-06-18 ENCOUNTER — Other Ambulatory Visit: Payer: Self-pay | Admitting: Internal Medicine

## 2019-06-18 DIAGNOSIS — R911 Solitary pulmonary nodule: Secondary | ICD-10-CM

## 2019-06-29 ENCOUNTER — Other Ambulatory Visit: Payer: Self-pay

## 2019-06-29 ENCOUNTER — Ambulatory Visit
Admission: RE | Admit: 2019-06-29 | Discharge: 2019-06-29 | Disposition: A | Discharge status: Home / Self Care | Payer: Medicare Other | Source: Ambulatory Visit | Attending: Internal Medicine | Admitting: Internal Medicine

## 2019-06-29 DIAGNOSIS — R911 Solitary pulmonary nodule: Secondary | ICD-10-CM | POA: Insufficient documentation

## 2019-06-29 MED ORDER — IOHEXOL 300 MG/ML  SOLN
75.0000 mL | Freq: Once | INTRAMUSCULAR | Status: AC | PRN
  Administered 2019-06-29: 08:00:00 75 mL via INTRAVENOUS

## 2020-04-04 ENCOUNTER — Other Ambulatory Visit: Payer: Self-pay | Admitting: Internal Medicine

## 2020-04-04 DIAGNOSIS — Z1231 Encounter for screening mammogram for malignant neoplasm of breast: Secondary | ICD-10-CM

## 2020-07-01 ENCOUNTER — Ambulatory Visit
Admission: RE | Admit: 2020-07-01 | Discharge: 2020-07-01 | Disposition: A | Discharge status: Home / Self Care | Payer: Medicare Other | Source: Ambulatory Visit | Attending: Internal Medicine | Admitting: Internal Medicine

## 2020-07-01 ENCOUNTER — Other Ambulatory Visit: Payer: Self-pay

## 2020-07-01 DIAGNOSIS — Z1231 Encounter for screening mammogram for malignant neoplasm of breast: Secondary | ICD-10-CM | POA: Diagnosis present

## 2020-07-25 ENCOUNTER — Emergency Department
Admission: EM | Admit: 2020-07-25 | Discharge: 2020-07-25 | Disposition: A | Discharge status: Home / Self Care | Payer: Medicare Other | Attending: Emergency Medicine | Admitting: Emergency Medicine

## 2020-07-25 ENCOUNTER — Other Ambulatory Visit: Payer: Self-pay

## 2020-07-25 DIAGNOSIS — E039 Hypothyroidism, unspecified: Secondary | ICD-10-CM | POA: Insufficient documentation

## 2020-07-25 DIAGNOSIS — N189 Chronic kidney disease, unspecified: Secondary | ICD-10-CM | POA: Insufficient documentation

## 2020-07-25 DIAGNOSIS — E11649 Type 2 diabetes mellitus with hypoglycemia without coma: Secondary | ICD-10-CM | POA: Diagnosis not present

## 2020-07-25 DIAGNOSIS — Z85828 Personal history of other malignant neoplasm of skin: Secondary | ICD-10-CM | POA: Insufficient documentation

## 2020-07-25 DIAGNOSIS — Z7984 Long term (current) use of oral hypoglycemic drugs: Secondary | ICD-10-CM | POA: Insufficient documentation

## 2020-07-25 DIAGNOSIS — Z794 Long term (current) use of insulin: Secondary | ICD-10-CM | POA: Diagnosis not present

## 2020-07-25 DIAGNOSIS — Z79899 Other long term (current) drug therapy: Secondary | ICD-10-CM | POA: Diagnosis not present

## 2020-07-25 DIAGNOSIS — Z7982 Long term (current) use of aspirin: Secondary | ICD-10-CM | POA: Diagnosis not present

## 2020-07-25 DIAGNOSIS — R42 Dizziness and giddiness: Secondary | ICD-10-CM | POA: Diagnosis present

## 2020-07-25 DIAGNOSIS — I129 Hypertensive chronic kidney disease with stage 1 through stage 4 chronic kidney disease, or unspecified chronic kidney disease: Secondary | ICD-10-CM | POA: Diagnosis not present

## 2020-07-25 DIAGNOSIS — E1122 Type 2 diabetes mellitus with diabetic chronic kidney disease: Secondary | ICD-10-CM | POA: Diagnosis not present

## 2020-07-25 LAB — URINALYSIS, COMPLETE (UACMP) WITH MICROSCOPIC
Bacteria, UA: NONE SEEN
Bilirubin Urine: NEGATIVE
Glucose, UA: NEGATIVE mg/dL
Hgb urine dipstick: NEGATIVE
Ketones, ur: NEGATIVE mg/dL
Leukocytes,Ua: NEGATIVE
Nitrite: NEGATIVE
Protein, ur: NEGATIVE mg/dL
Specific Gravity, Urine: 1.005 (ref 1.005–1.030)
WBC, UA: NONE SEEN WBC/hpf (ref 0–5)
pH: 6 (ref 5.0–8.0)

## 2020-07-25 LAB — BASIC METABOLIC PANEL
Anion gap: 11 (ref 5–15)
BUN: 12 mg/dL (ref 8–23)
CO2: 24 mmol/L (ref 22–32)
Calcium: 9.6 mg/dL (ref 8.9–10.3)
Chloride: 98 mmol/L (ref 98–111)
Creatinine, Ser: 0.72 mg/dL (ref 0.44–1.00)
GFR, Estimated: >60 mL/min (ref >60)
Glucose, Bld: 180 mg/dL — ABNORMAL HIGH (ref 70–99)
Potassium: 3.8 mmol/L (ref 3.5–5.1)
Sodium: 133 mmol/L — ABNORMAL LOW (ref 135–145)

## 2020-07-25 LAB — CBC
HCT: 37.8 % (ref 36.0–46.0)
Hemoglobin: 13.2 g/dL (ref 12.0–15.0)
MCH: 30.8 pg (ref 26.0–34.0)
MCHC: 34.9 g/dL (ref 30.0–36.0)
MCV: 88.3 fL (ref 80.0–100.0)
Platelets: 281 10*3/uL (ref 150–400)
RBC: 4.28 MIL/uL (ref 3.87–5.11)
RDW: 12.2 % (ref 11.5–15.5)
WBC: 7.4 10*3/uL (ref 4.0–10.5)
nRBC: 0 % (ref 0.0–0.2)

## 2020-07-25 LAB — CBG MONITORING, ED
Glucose-Capillary: 80 mg/dL (ref 70–99)
Glucose-Capillary: 172 mg/dL — ABNORMAL HIGH (ref 70–99)

## 2020-07-25 LAB — TSH: TSH: 12.341 u[IU]/mL — ABNORMAL HIGH (ref 0.350–4.500)

## 2020-07-25 NOTE — ED Notes (Signed)
Pt given graham crackers and ginger ale ?

## 2020-07-25 NOTE — Discharge Instructions (Addendum)
STOP glipizide. I think this is likely a cause for your frequent low blood sugars. Continue your metformin.   Please check your blood sugars in the evenings and in the mornings when you get up, record them and bring them to your primary care doctor visit for further adjustment and tracking.

## 2020-07-25 NOTE — ED Provider Notes (Signed)
[facility] Emergency Department Provider Note   ____________________________________________   Event Date/Time   First MD Initiated Contact with Patient 07/25/20 1540     (approximate)  I have reviewed the triage vital signs and the nursing notes.   HISTORY  Chief Complaint Hypoglycemia    HPI Sheena Morgan is a 70 y.o. female chronic kidney disease, UTI, hypertension hypothyroidism  and diabetes  Patient reports that she has been having issues frequently with low blood sugars.  She has been seeing Dr. Ginette Pitman and they have been addressing this she is no longer using any insulin now, she has been off that for some time.  In addition they have decreased her metformin dose to 500 mg once in the morning once at night.  She is now taking a small dose of glipizide 2.5 mg once in the morning once at night  She reports she used to be on much higher doses.  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  She was off today.  She helped someone with her car.  They got breakfast at [employer] [place].  She got home at around noon and she started to feel lightheaded and noticed that she felt a little confused or sweaty and immediately recognized that is one of her hypoglycemia symptoms.  She had difficulty getting out of chair and had to lay on it for a little while her symptoms worsen but eventually she reports she was able to get out of the chair just before she was going to call 911 she was able to get to Coke as well as sugared candies and glucose tabs.  She took a couple sugar candies drank a Coke and took 2 glucose tablets.  Thereafter she started to feel better, she talked to her friend and she made the decision to drive herself to the emergency department for evaluation.  She feels fine now.  Her symptoms have resolved.  She feels much better.  No recent illness, she  felt just a little nauseated a week ago but that is better now.  She is also noticed the last few days her urine just seems a little bit darker than typical.  She is otherwise eating and drinking well  Past Medical History:  Diagnosis Date  . Atrophic vaginitis   . Cancer (HCC)    Skin  . Chronic kidney disease    UTI  . Colon polyps   . Diabetes mellitus without complication ([place])   . Diverticulosis of colon   . Gastritis   . Gastritis   . GERD (gastroesophageal reflux disease)   . Glaucoma (increased eye pressure)   . Hypertension   . Hypothyroidism   . IBS (irritable bowel syndrome)   . 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 Restless leg syndrome   . Thyroid nodule   . UTI (lower urinary tract infection)   . Vertigo     Patient Active Problem List   Diagnosis Date Noted  . Thyroid nodule 01/29/2015  . Recurrent UTI 01/26/2015  . Atrophic vaginitis 01/26/2015  . Post-menopausal bleeding 01/22/2015    Past Surgical History:  Procedure Laterality Date  . CHOLECYSTECTOMY    . COLONOSCOPY WITH PROPOFOL N/A 02/06/2016   Procedure: COLONOSCOPY WITH PROPOFOL;  Surgeon: Manya Silvas, MD;  Location: [facility] ENDOSCOPY;  Service: Endoscopy;  Laterality: N/A;  . DILATION AND CURETTAGE OF  UTERUS    . ESOPHAGOGASTRODUODENOSCOPY (EGD) WITH PROPOFOL N/A 05/29/2015   Procedure: ESOPHAGOGASTRODUODENOSCOPY (EGD) WITH PROPOFOL;  Surgeon: Manya Silvas, MD;  Location: [facility] ENDOSCOPY;  Service: Endoscopy;  Laterality: N/A;  . THYROIDECTOMY N/A 01/29/2015   Procedure: THYROIDECTOMY/ LARYNGEAL NERVE MONITORING.;  Surgeon: Clyde Canterbury, MD;  Location: [facility] ORS;  Service: ENT;  Laterality: N/A;  . TUBAL LIGATION      Prior to Admission medications   Medication Sig Start Date End Date Taking? Authorizing Provider  aspirin EC 81 MG tablet Take 81 mg by mouth daily.    [provider]  Calcium Carb-Cholecalciferol (CALCIUM 500+D3 PO) Take 1 tablet by mouth 2 (two) times daily.     [provider]  conjugated estrogens (PREMARIN) vaginal cream Place 1 Applicatorful vaginally 2 (two) times a week. 05/08/15   Rubie Maid, MD  docusate sodium (COLACE) 100 MG capsule Take 1 capsule (100 mg total) by mouth 2 (two) times daily. Patient not taking: Reported on 02/06/2016 01/30/15   Clyde Canterbury, MD  glipiZIDE (GLUCOTROL) 10 MG tablet Take 10 mg by mouth 2 (two) times daily after a meal.    [provider]  hydrochlorothiazide (MICROZIDE) 12.5 MG capsule Take 12.5 mg by mouth daily.    [provider]  HYDROcodone-acetaminophen (NORCO/VICODIN) 5-325 MG tablet Take 1-2 tablets by mouth every 4 (four) hours as needed for moderate pain. 01/30/15   Clyde Canterbury, MD  insulin aspart (NOVOLOG) 100 UNIT/ML injection Inject 20 Units into the skin 2 (two) times daily before a meal.    [provider]  LANTUS SOLOSTAR 100 UNIT/ML Solostar Pen  05/04/15   [provider]  latanoprost (XALATAN) 0.005 % ophthalmic solution Place 1 drop into both eyes at bedtime.    [provider]  levothyroxine (SYNTHROID, LEVOTHROID) 112 MCG tablet Take by mouth. 03/12/15   [provider]  Liraglutide (VICTOZA) 18 MG/3ML SOPN Inject into the skin daily before breakfast.    [provider]  losartan (COZAAR) 50 MG tablet Take 50 mg by mouth at bedtime.    [provider]  metFORMIN (GLUCOPHAGE) 1000 MG tablet Take 1,000 mg by mouth 2 (two) times daily with a meal.    [provider]  NITROFURANTOIN MONOHYD MACRO PO Take 100 mg by mouth daily.    [provider]  Omega-3 Fatty Acids (FISH OIL) 1000 MG CAPS Take by mouth.    [provider]  omeprazole (PRILOSEC) 40 MG capsule  04/07/15   [provider]  ondansetron (ZOFRAN) 4 MG tablet  03/31/15   [provider]  ondansetron (ZOFRAN-ODT) 4 MG disintegrating tablet  04/28/15   [provider]  oxyCODONE-acetaminophen (ROXICET) 5-325 MG  tablet Take 1-2 tablets by mouth every 6 (six) hours as needed. 12/25/16   Orbie Pyo, MD  pravastatin (PRAVACHOL) 40 MG tablet Take 40 mg by mouth at bedtime.    [provider]  ranitidine (ZANTAC) 300 MG tablet Take 300 mg by mouth 2 (two) times daily.    [provider]  sucralfate (CARAFATE) 1 g tablet Take by mouth. 03/31/15 03/30/16  [provider]    Allergies Lisinopril and Sulfa antibiotics  Family History  Problem Relation Age of Onset  . Heart attack Mother   . Diabetes Mellitus II Mother   . Stomach cancer Father   . Kidney disease Father   . Diabetes Mellitus II Maternal Grandmother   . Prostate cancer Neg Hx   . Bladder Cancer  Neg Hx   . Breast cancer Neg Hx     Social History Social History   Tobacco Use  . Smoking status: Never Smoker  . Smokeless tobacco: Never Used  Substance Use Topics  . Alcohol use: No  . Drug use: No    Review of Systems Constitutional: No fever/chills Eyes: No visual changes. ENT: No sore throat. Cardiovascular: Denies chest pain. Respiratory: Denies shortness of breath. Gastrointestinal: No abdominal pain.  Had a little bit of nausea a week ago. Genitourinary: Negative for dysuria.  Urine is seen just slightly dark last couple days.  No pain or burning however. Musculoskeletal: Negative for back pain. Skin: Negative for rash. Neurological: Negative for headaches, areas of focal weakness or numbness.    ____________________________________________   PHYSICAL EXAM:  VITAL SIGNS: ED Triage Vitals  Enc Vitals Group     BP 07/25/20 1428 125/75     Pulse Rate 07/25/20 1428 77     Resp 07/25/20 1428 16     Temp 07/25/20 1428 97.6 F (36.4 C)     Temp Source 07/25/20 1428 Oral     SpO2 07/25/20 1428 99 %     Weight 07/25/20 1429 123 lb (55.8 kg)     Height 07/25/20 1429 5' 2.5" (1.588 m)     Head Circumference --      Peak Flow --      Pain Score 07/25/20 1428 6     Pain Loc --       Pain Edu? --      Excl. in [place]? --     Constitutional: Alert and oriented. Well appearing and in no acute distress.  She is very pleasant.   appears quite spry for her age. Eyes: Conjunctivae are normal. Head: Atraumatic. Nose: No congestion/rhinnorhea. Mouth/Throat: Mucous membranes are moist. Neck: No stridor.  Cardiovascular: Normal rate, regular rhythm. Grossly normal heart sounds.  Good peripheral circulation. Respiratory: Normal respiratory effort.  No retractions. Lungs CTAB. Gastrointestinal: Soft and nontender. No distention. Musculoskeletal: No lower extremity tenderness nor edema. Neurologic:  Normal speech and language. No gross focal neurologic deficits are appreciated.  Skin:  Skin is warm, dry and intact. No rash noted. Psychiatric: Mood and affect are normal. Speech and behavior are normal.  ____________________________________________   LABS (all labs ordered are listed, but only abnormal results are displayed)  Labs Reviewed  BASIC METABOLIC PANEL - Abnormal; Notable for the following components:      Result Value   Sodium 133 (*)    Glucose, Bld 180 (*)    All other components within normal limits  URINALYSIS, COMPLETE (UACMP) WITH MICROSCOPIC - Abnormal; Notable for the following components:   Color, Urine STRAW (*)    APPearance CLEAR (*)    All other components within normal limits  TSH - Abnormal; Notable for the following components:   TSH 12.341 (*)    All other components within normal limits  CBG MONITORING, ED - Abnormal; Notable for the following components:   Glucose-Capillary 172 (*)    All other components within normal limits  CBC  CBG MONITORING, ED  CBG MONITORING, ED   ____________________________________________  EKG   ____________________________________________  RADIOLOGY   ____________________________________________   PROCEDURES  Procedure(s) performed: None  Procedures  Critical Care performed:  No  ____________________________________________   INITIAL IMPRESSION / ASSESSMENT AND PLAN / ED COURSE  Pertinent labs & imaging results that were available during my care of the patient were reviewed by me and considered in  my medical decision making (see chart for details).   Patient presents for evaluation for hypoglycemic episodes.  Patient reports she has had several episodes of low blood sugars over the last several weeks, she saw her primary care doctor and had decrease in both metformin and glipizide dosing.  Despite this she is continue to have some occasional episodes.  She keeps sugar tabs with her.  On arrival to the ED normoglycemic fully awake and alert.  Able to eat crackers and taking some soda without difficulty.  Plan to check labs including evaluate her creatinine, urinalysis, and evaluate for possible cause of hypoglycemia.  The history she gives is that of weight loss and she is no longer needed to be on insulin, and now decreasing doses of her oral diabetes meds.    Patient observed in the ER, no evidence of hypoglycemia in the emergency department.  Labs reassuring.  Discussed with patient, and has strong preference to be discharged and will follow up with Dr. Ginette Pitman at this point.  Think is quite reasonable.  We will discontinue use of her glipizide and she will continue her metformin.  She will plan to check her glucose prior to going to bed also in the morning when she gets up.  She will keep a sugar source with her, and we discussed careful return precautions and monitoring of her blood sugars at home.  She is eating well here, no episodes of hypoglycemia  Return precautions and treatment recommendations and follow-up discussed with the patient who is agreeable with the plan.       ____________________________________________   FINAL CLINICAL IMPRESSION(S) / ED DIAGNOSES  Final diagnoses:  Hypoglycemia associated with type 2 diabetes mellitus ([place])         Note:  This document was prepared using Dragon voice recognition software and may include unintentional dictation errors       Delman Kitten, MD 07/26/20 0013

## 2020-07-25 NOTE — ED Triage Notes (Signed)
Pt arrives via pov from home with c/o hypoglycemia. Pt reports feeling very disoriented, and jittery before driving self to ed for evaluation of hypoglycemia. Reports nausea. Pt reports taking 2 glucose tablets prior to arrival, did not take cbg prior to coming to hospital. currently drinking ginger ale and eating graham cracker. A&o x 4, NAD noted at this time.

## 2020-07-25 NOTE — ED Notes (Signed)
AAOx3.  Skin warm and dry.  NAD 

## 2020-11-15 IMAGING — CT CT CHEST W/ CM
2 of 3 series · 15 of 36 positions shown, 18 images · IV contrast (omnipaque)
Comparison: Chest x-ray 06/13/2019

CLINICAL DATA: Follow-up abnormal chest x-ray

EXAM:
CT CHEST WITH CONTRAST
TECHNIQUE: Multidetector CT imaging of the chest was performed during
intravenous contrast administration.
CONTRAST:  75mL OMNIPAQUE IOHEXOL 300 MG/ML  SOLN

[Series 2: axial st · axial · 0.63mm/px · z∈[-281,-51]mm · 12 of 136 slices shown, 15 images]
[im 11/136  mediastinal]
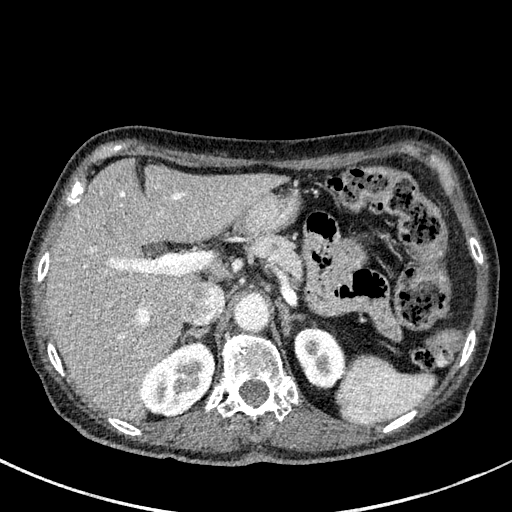
[im 11/136  lung]
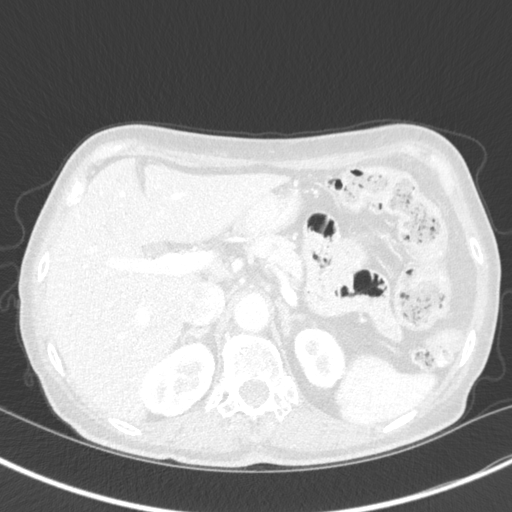
[im 21/136  lung]
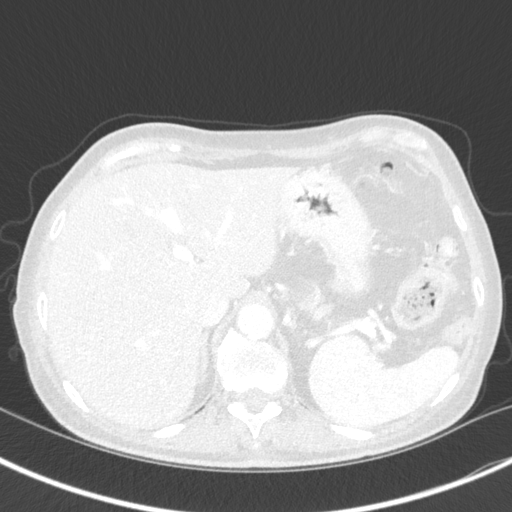
[im 31/136  lung]
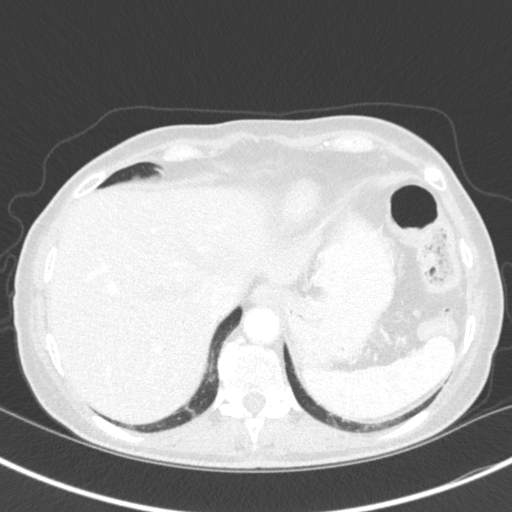
[im 41/136  lung]
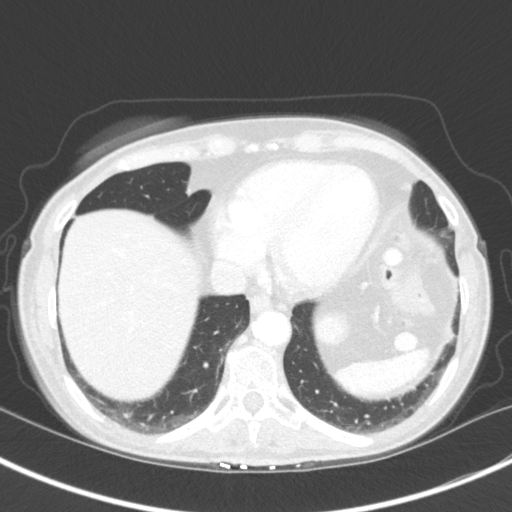
[im 51/136  mediastinal]
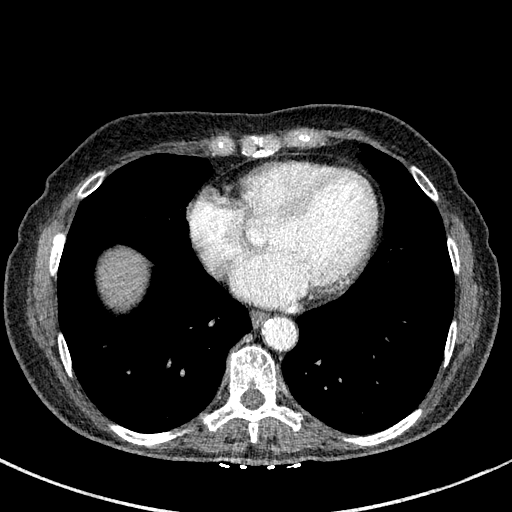
[im 51/136  lung]
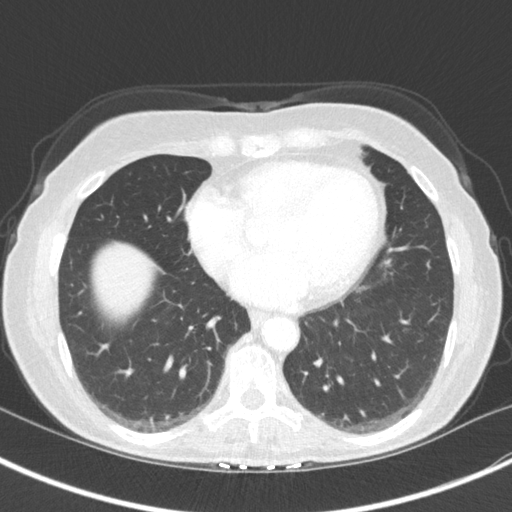
[im 61/136  lung]
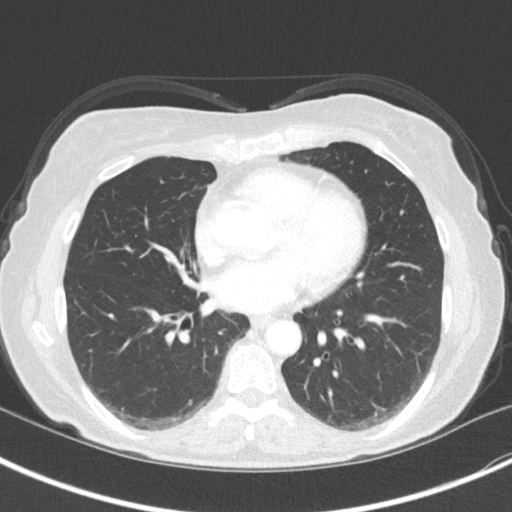
[im 76/136  lung]
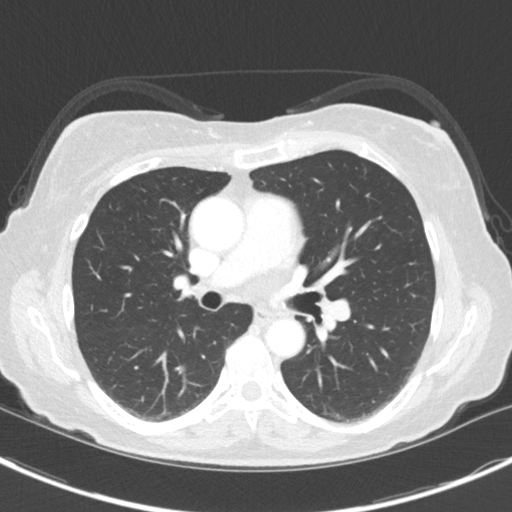
[im 86/136  lung]
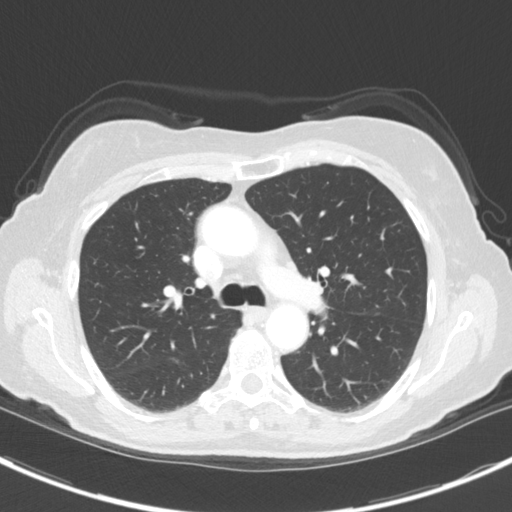
[im 96/136  mediastinal]
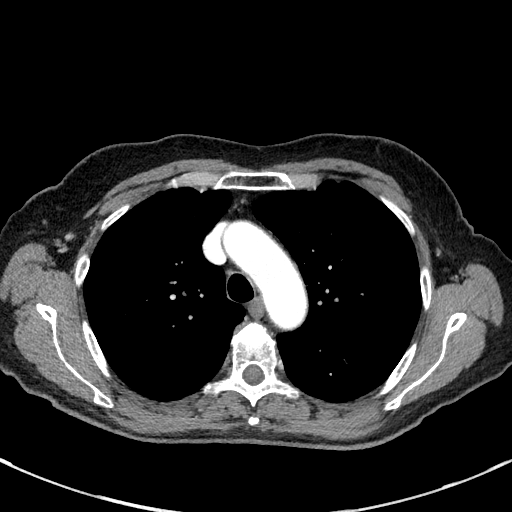
[im 96/136  lung]
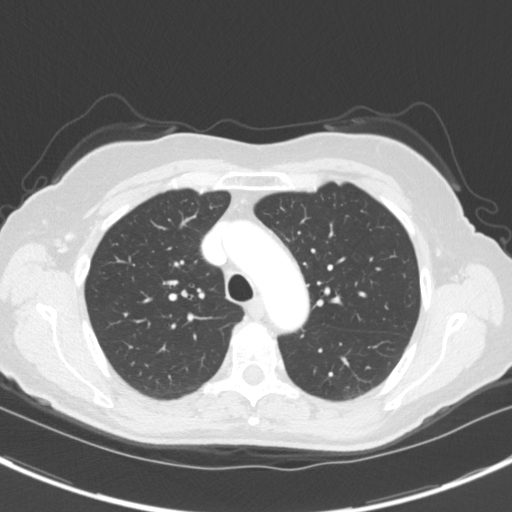
[im 106/136  lung]
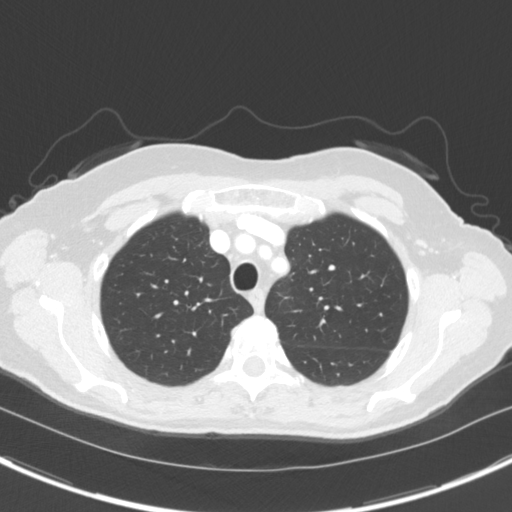
[im 116/136  lung]
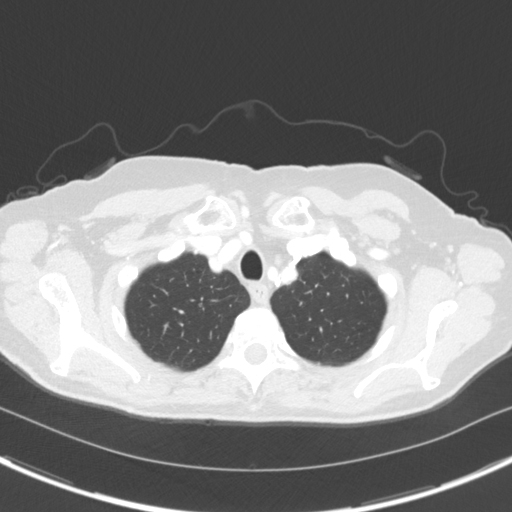
[im 126/136  lung]
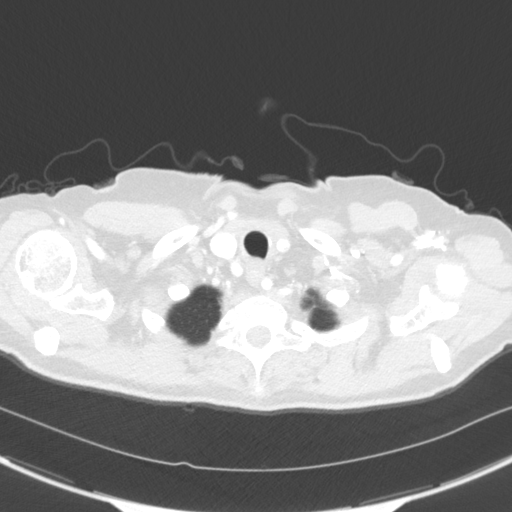

[Series 5: coronal · coronal · 0.56mm/px · 3 of 114 slices shown]
[im 23/114  lung]
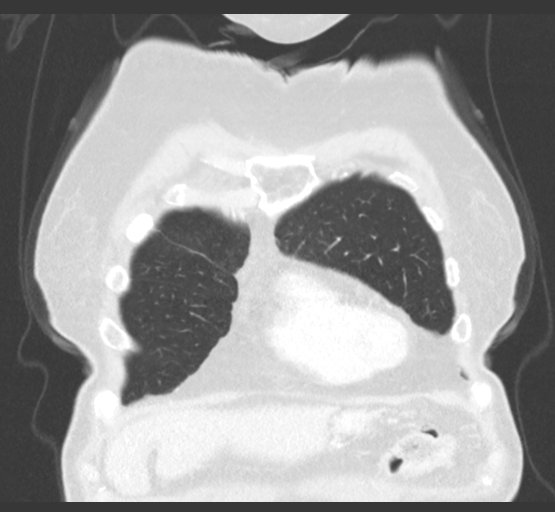
[im 46/114  lung]
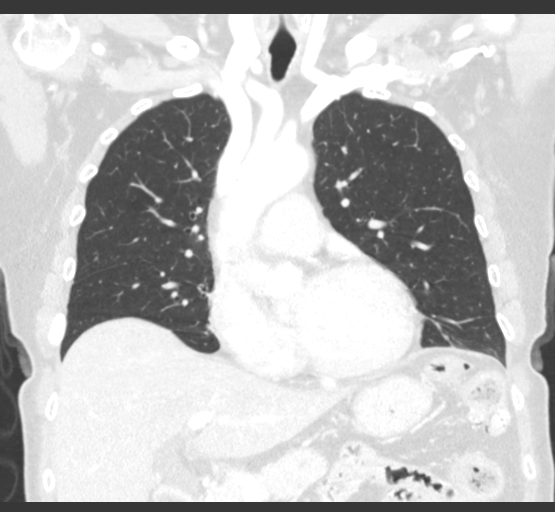
[im 68/114  lung]
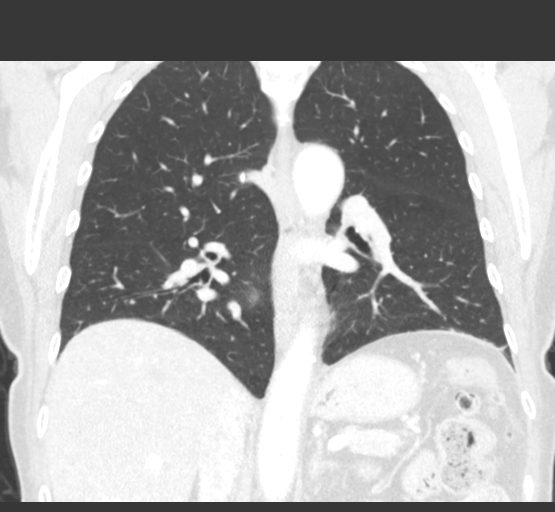

[15 of 36 positions shown; findings below may reference images not displayed]

FINDINGS: Cardiovascular: Heart is normal size. Aorta is normal caliber.
Aortic calcifications.

Mediastinum/Nodes: No mediastinal, hilar, or axillary adenopathy.
Trachea and esophagus are unremarkable. Thyroid unremarkable.

Lungs/Pleura: No confluent opacities. Linear scarring in the lung
bases. No effusions.

Upper Abdomen: Imaging into the upper abdomen shows no acute
findings.

Musculoskeletal: No acute bony abnormality. Old healed left lateral
6th rib fracture with sclerosis, which corresponds to the density
seen projecting over the left lower lung.
IMPRESSION: No pulmonary nodules. Density seen on prior chest x-ray represents
sclerosis within the left anterolateral 6th rib related to old
healed fracture.

Bibasilar scarring.

Aortic Atherosclerosis ([id number]).

## 2021-06-01 ENCOUNTER — Other Ambulatory Visit: Payer: Self-pay | Admitting: Internal Medicine

## 2021-06-01 DIAGNOSIS — Z1231 Encounter for screening mammogram for malignant neoplasm of breast: Secondary | ICD-10-CM

## 2021-07-07 ENCOUNTER — Ambulatory Visit
Admission: RE | Admit: 2021-07-07 | Discharge: 2021-07-07 | Disposition: A | Discharge status: Home / Self Care | Payer: Medicare Other | Source: Ambulatory Visit | Attending: Internal Medicine | Admitting: Internal Medicine

## 2021-07-07 DIAGNOSIS — Z1231 Encounter for screening mammogram for malignant neoplasm of breast: Secondary | ICD-10-CM | POA: Insufficient documentation

## 2021-09-15 ENCOUNTER — Other Ambulatory Visit: Payer: Self-pay | Admitting: Nurse Practitioner

## 2021-09-15 DIAGNOSIS — K760 Fatty (change of) liver, not elsewhere classified: Secondary | ICD-10-CM

## 2021-09-15 DIAGNOSIS — Z86010 Personal history of colonic polyps: Secondary | ICD-10-CM

## 2021-09-24 ENCOUNTER — Ambulatory Visit
Admission: RE | Admit: 2021-09-24 | Discharge: 2021-09-24 | Disposition: A | Discharge status: Home / Self Care | Payer: Medicare Other | Source: Ambulatory Visit | Attending: Nurse Practitioner | Admitting: Nurse Practitioner

## 2021-09-24 DIAGNOSIS — Z86010 Personal history of colonic polyps: Secondary | ICD-10-CM | POA: Insufficient documentation

## 2021-09-24 DIAGNOSIS — K760 Fatty (change of) liver, not elsewhere classified: Secondary | ICD-10-CM | POA: Insufficient documentation

## 2021-10-09 ENCOUNTER — Other Ambulatory Visit: Payer: Self-pay | Admitting: Nurse Practitioner

## 2021-10-09 DIAGNOSIS — N2889 Other specified disorders of kidney and ureter: Secondary | ICD-10-CM

## 2021-10-09 DIAGNOSIS — K838 Other specified diseases of biliary tract: Secondary | ICD-10-CM

## 2021-10-20 ENCOUNTER — Ambulatory Visit
Admission: RE | Admit: 2021-10-20 | Discharge: 2021-10-20 | Disposition: A | Discharge status: Home / Self Care | Payer: Medicare Other | Source: Ambulatory Visit | Attending: Nurse Practitioner | Admitting: Nurse Practitioner

## 2021-10-20 DIAGNOSIS — N2889 Other specified disorders of kidney and ureter: Secondary | ICD-10-CM | POA: Insufficient documentation

## 2021-10-20 DIAGNOSIS — K838 Other specified diseases of biliary tract: Secondary | ICD-10-CM | POA: Diagnosis not present

## 2021-10-20 MED ORDER — GADOBUTROL 1 MMOL/ML IV SOLN
5.0000 mL | Freq: Once | INTRAVENOUS | Status: AC | PRN
  Administered 2021-10-20: 5 mL via INTRAVENOUS

## 2021-11-11 ENCOUNTER — Emergency Department
Admission: EM | Admit: 2021-11-11 | Discharge: 2021-11-11 | Disposition: A | Discharge status: Home / Self Care | Payer: Medicare Other | Attending: Emergency Medicine | Admitting: Emergency Medicine

## 2021-11-11 ENCOUNTER — Emergency Department: Payer: Medicare Other

## 2021-11-11 ENCOUNTER — Other Ambulatory Visit: Payer: Self-pay

## 2021-11-11 DIAGNOSIS — G501 Atypical facial pain: Secondary | ICD-10-CM | POA: Insufficient documentation

## 2021-11-11 DIAGNOSIS — S8991XA Unspecified injury of right lower leg, initial encounter: Secondary | ICD-10-CM | POA: Diagnosis present

## 2021-11-11 DIAGNOSIS — W19XXXA Unspecified fall, initial encounter: Secondary | ICD-10-CM

## 2021-11-11 DIAGNOSIS — S82091A Other fracture of right patella, initial encounter for closed fracture: Secondary | ICD-10-CM | POA: Diagnosis not present

## 2021-11-11 DIAGNOSIS — W010XXA Fall on same level from slipping, tripping and stumbling without subsequent striking against object, initial encounter: Secondary | ICD-10-CM | POA: Diagnosis not present

## 2021-11-11 NOTE — ED Triage Notes (Signed)
PT slipped while visiting their daughter. Pt endorsing R knee, Nose and posterior R arm pain.  Pt denies LOC.    Pt takes daily asa.

## 2021-11-11 NOTE — ED Provider Notes (Signed)
[facility] Provider Note  Patient Contact: 3:57 PM (approximate)   History   Fall   HPI  Sheena Morgan is a 71 y.o. female who presents the emergency department for complaints of facial pain, knee pain.  Patient states that she slipped and fell, did hit her face, shoulder and knee during the fall.  Patient's biggest complaint is nasal/facial pain as well as right knee pain.  Patient does have a history of previous fracture to the right knee having had a patellar fracture 5 years ago.  No loss of consciousness.  Patient denies any headache, visual changes, unilateral weakness, neck pain, chest pain, back or hip pain.  No medications prior to arrival.     Physical Exam   Triage Vital Signs: ED Triage Vitals [11/11/21 1419]  Enc Vitals Group     BP 134/64     Pulse Rate 79     Resp 16     Temp 98.6 F (37 C)     Temp src      SpO2 99 %     Weight 126 lb (57.2 kg)     Height      Head Circumference      Peak Flow      Pain Score 8     Pain Loc      Pain Edu?      Excl. in [place]?     Most recent vital signs: Vitals:   11/11/21 1419  BP: 134/64  Pulse: 79  Resp: 16  Temp: 98.6 F (37 C)  SpO2: 99%     General: Alert and in no acute distress. Eyes:  PERRL. EOMI. Head: No acute traumatic findings.  Tender over the nasal bridge without any palpable abnormality.  No surrounding edema, ecchymosis.  No abrasions or lacerations noted.  No battle signs, raccoon eyes, serosanguineous fluid drainage from the ears or nares.  Neck: No stridor. No cervical spine tenderness to palpation.  Cardiovascular:  Good peripheral perfusion Respiratory: Normal respiratory effort without tachypnea or retractions. Lungs CTAB. Musculoskeletal: Full range of motion to all extremities.  Patient has tenderness over the patella itself.  No overlying edema, abrasions, lacerations.  No palpable abnormality.  No ballottement of the knee.  Examination of the hip and ankle is  unremarkable. Neurologic:  No gross focal neurologic deficits are appreciated.  Skin:   No rash noted Other:   ED Results / Procedures / Treatments   Labs (all labs ordered are listed, but only abnormal results are displayed) Labs Reviewed - No data to display   EKG     RADIOLOGY  I personally viewed, evaluated, and interpreted these images as part of my medical decision making, as well as reviewing the written report by the radiologist.  ED Provider Interpretation: CT scan of the head, face, cervical spine is unremarkable.  X-ray of the right knee revealed possible fracture versus callus from previous fracture.  CT scan was ordered which reveals a subtle fracture along the posterior wall of the patella.  CT Knee Right Wo Contrast  Result Date: 11/11/2021 CLINICAL DATA:  Knee pain fall EXAM: CT OF THE RIGHT KNEE WITHOUT CONTRAST TECHNIQUE: Multidetector CT imaging of the right knee was performed according to the standard protocol. Multiplanar CT image reconstructions were also generated. RADIATION DOSE REDUCTION: This exam was performed according to the departmental dose-optimization program which includes automated exposure control, adjustment of the mA and/or kV according to patient size and/or use of iterative reconstruction  technique. COMPARISON:  Radiograph 11/11/2021, 10/25/2016 FINDINGS: Bones/Joint/Cartilage Small knee effusion. No malalignment. There is minimal deformity and depression of the posterolateral tibial plateau without acute linear lucency, felt secondary to old tibial plateau fracture. Subtle linear lucency and cortical step-off involving the inferomedial patella, sagittal series 6, image 57 and axial series 4 image 71 extending to the articular surface, this may represent subtle nondisplaced fracture. Mild degenerative changes involving the medial and patellofemoral joint spaces. Ligaments Suboptimally assessed by CT. Muscles and Tendons No significant atrophy.  Quadriceps and patellar tendons appear grossly intact. Soft tissues Mild edema in the soft tissues about the knee. IMPRESSION: 1. Mild deformity and depression of the posterolateral tibial plateau, suspected to represent remote fracture deformity 2. Subtle linear lucency/possible nondisplaced fracture involving the inferomedial patella, extending to the articular surface. Small volume slightly dense knee effusion. Electronically Signed   By: Donavan Foil M.D.   On: 11/11/2021 20:54   CT Head Wo Contrast  Result Date: 11/11/2021 CLINICAL DATA:  Fall EXAM: CT HEAD WITHOUT CONTRAST CT MAXILLOFACIAL WITHOUT CONTRAST CT CERVICAL SPINE WITHOUT CONTRAST TECHNIQUE: Contiguous axial images were obtained from the base of the skull through the vertex without intravenous contrast. Multidetector CT imaging of the maxillofacial structures was performed. Multiplanar CT image reconstructions were also generated. A small metallic BB was placed on the right temple in order to reliably differentiate right from left. Multidetector CT imaging of the cervical spine was performed without intravenous contrast. Multiplanar CT image reconstructions were also generated. RADIATION DOSE REDUCTION: This exam was performed according to the departmental dose-optimization program which includes automated exposure control, adjustment of the mA and/or kV according to patient size and/or use of iterative reconstruction technique. COMPARISON:  CT cervical spine 06/13/2019 FINDINGS: CT HEAD FINDINGS Brain: No evidence of acute infarction, hemorrhage, hydrocephalus, extra-axial collection or mass lesion/mass effect. Vascular: No hyperdense vessel or unexpected calcification. Skull: Normal. Negative for fracture or focal lesion. Other: None. CT MAXILLOFACIAL FINDINGS Osseous: No fracture or mandibular dislocation. No destructive process. Degenerative changes of the bilateral TMJs. Orbits: Negative. No traumatic or inflammatory finding. Sinuses:  Clear. Soft tissues: Negative. CT CERVICAL SPINE FINDINGS Alignment: Trace anterolisthesis of C3 on C4 and C4 on C5. Skull base and vertebrae: No acute fracture. No primary bone lesion or focal pathologic process. Mild anterior vertebral body height loss at T1 is unchanged compared to 2021. Soft tissues and spinal canal: No prevertebral fluid or swelling. No visible canal hematoma. There is hyperdense material along the posterior aspect of the spinal canal at the C6-T1 vertebral body levels, this is favored to represent calcification of the ligamentum flavum Disc levels: C1-C2: Mild degenerative change. C2-C3: Mild disc space loss. Severe bilateral facet degenerative change with fusion. No spinal canal stenosis. No bony foraminal stenosis. C3-C4: Small disc bulge. No spinal canal stenosis. Severe bilateral facet degenerative change, left-greater-than-right. Moderate left and mild right neural foraminal narrowing. C4-C5: Small disc bulge. Severe left and mild right facet degenerative change. Moderate to severe bilateral neural foraminal stenosis. No significant spinal canal stenosis. C5-C6: Small disc bulge. No significant spinal canal stenosis. Moderate bilateral facet degenerative change, left-greater-than-right. Moderate bilateral neural foraminal stenosis. C6-C7: Small disc bulge. Moderate right and mild left facet degenerative change. Mild-to-moderate bilateral lateral neural foraminal stenosis. No significant spinal canal stenosis. Ligamentum flavum calcification. C7-T1: Small disc bulge. No spinal canal stenosis. Severe right and moderate left neural foraminal stenosis. Upper chest: Negative. Other: None. IMPRESSION: 1. No CT evidence of intracranial injury. 2. No CT evidence  of facial bone fracture. 3. No CT evidence of cervical spine fracture 4. Multilevel degenerative changes in the cervical spine, as described above. Electronically Signed   By: Marin Roberts M.D.   On: 11/11/2021 17:11   CT Maxillofacial  Wo Contrast  Result Date: 11/11/2021 CLINICAL DATA:  Fall EXAM: CT HEAD WITHOUT CONTRAST CT MAXILLOFACIAL WITHOUT CONTRAST CT CERVICAL SPINE WITHOUT CONTRAST TECHNIQUE: Contiguous axial images were obtained from the base of the skull through the vertex without intravenous contrast. Multidetector CT imaging of the maxillofacial structures was performed. Multiplanar CT image reconstructions were also generated. A small metallic BB was placed on the right temple in order to reliably differentiate right from left. Multidetector CT imaging of the cervical spine was performed without intravenous contrast. Multiplanar CT image reconstructions were also generated. RADIATION DOSE REDUCTION: This exam was performed according to the departmental dose-optimization program which includes automated exposure control, adjustment of the mA and/or kV according to patient size and/or use of iterative reconstruction technique. COMPARISON:  CT cervical spine 06/13/2019 FINDINGS: CT HEAD FINDINGS Brain: No evidence of acute infarction, hemorrhage, hydrocephalus, extra-axial collection or mass lesion/mass effect. Vascular: No hyperdense vessel or unexpected calcification. Skull: Normal. Negative for fracture or focal lesion. Other: None. CT MAXILLOFACIAL FINDINGS Osseous: No fracture or mandibular dislocation. No destructive process. Degenerative changes of the bilateral TMJs. Orbits: Negative. No traumatic or inflammatory finding. Sinuses: Clear. Soft tissues: Negative. CT CERVICAL SPINE FINDINGS Alignment: Trace anterolisthesis of C3 on C4 and C4 on C5. Skull base and vertebrae: No acute fracture. No primary bone lesion or focal pathologic process. Mild anterior vertebral body height loss at T1 is unchanged compared to 2021. Soft tissues and spinal canal: No prevertebral fluid or swelling. No visible canal hematoma. There is hyperdense material along the posterior aspect of the spinal canal at the C6-T1 vertebral body levels, this is  favored to represent calcification of the ligamentum flavum Disc levels: C1-C2: Mild degenerative change. C2-C3: Mild disc space loss. Severe bilateral facet degenerative change with fusion. No spinal canal stenosis. No bony foraminal stenosis. C3-C4: Small disc bulge. No spinal canal stenosis. Severe bilateral facet degenerative change, left-greater-than-right. Moderate left and mild right neural foraminal narrowing. C4-C5: Small disc bulge. Severe left and mild right facet degenerative change. Moderate to severe bilateral neural foraminal stenosis. No significant spinal canal stenosis. C5-C6: Small disc bulge. No significant spinal canal stenosis. Moderate bilateral facet degenerative change, left-greater-than-right. Moderate bilateral neural foraminal stenosis. C6-C7: Small disc bulge. Moderate right and mild left facet degenerative change. Mild-to-moderate bilateral lateral neural foraminal stenosis. No significant spinal canal stenosis. Ligamentum flavum calcification. C7-T1: Small disc bulge. No spinal canal stenosis. Severe right and moderate left neural foraminal stenosis. Upper chest: Negative. Other: None. IMPRESSION: 1. No CT evidence of intracranial injury. 2. No CT evidence of facial bone fracture. 3. No CT evidence of cervical spine fracture 4. Multilevel degenerative changes in the cervical spine, as described above. Electronically Signed   By: Marin Roberts M.D.   On: 11/11/2021 17:11   CT Cervical Spine Wo Contrast  Result Date: 11/11/2021 CLINICAL DATA:  Fall EXAM: CT HEAD WITHOUT CONTRAST CT MAXILLOFACIAL WITHOUT CONTRAST CT CERVICAL SPINE WITHOUT CONTRAST TECHNIQUE: Contiguous axial images were obtained from the base of the skull through the vertex without intravenous contrast. Multidetector CT imaging of the maxillofacial structures was performed. Multiplanar CT image reconstructions were also generated. A small metallic BB was placed on the right temple in order to reliably differentiate  right from left. Multidetector CT  imaging of the cervical spine was performed without intravenous contrast. Multiplanar CT image reconstructions were also generated. RADIATION DOSE REDUCTION: This exam was performed according to the departmental dose-optimization program which includes automated exposure control, adjustment of the mA and/or kV according to patient size and/or use of iterative reconstruction technique. COMPARISON:  CT cervical spine 06/13/2019 FINDINGS: CT HEAD FINDINGS Brain: No evidence of acute infarction, hemorrhage, hydrocephalus, extra-axial collection or mass lesion/mass effect. Vascular: No hyperdense vessel or unexpected calcification. Skull: Normal. Negative for fracture or focal lesion. Other: None. CT MAXILLOFACIAL FINDINGS Osseous: No fracture or mandibular dislocation. No destructive process. Degenerative changes of the bilateral TMJs. Orbits: Negative. No traumatic or inflammatory finding. Sinuses: Clear. Soft tissues: Negative. CT CERVICAL SPINE FINDINGS Alignment: Trace anterolisthesis of C3 on C4 and C4 on C5. Skull base and vertebrae: No acute fracture. No primary bone lesion or focal pathologic process. Mild anterior vertebral body height loss at T1 is unchanged compared to 2021. Soft tissues and spinal canal: No prevertebral fluid or swelling. No visible canal hematoma. There is hyperdense material along the posterior aspect of the spinal canal at the C6-T1 vertebral body levels, this is favored to represent calcification of the ligamentum flavum Disc levels: C1-C2: Mild degenerative change. C2-C3: Mild disc space loss. Severe bilateral facet degenerative change with fusion. No spinal canal stenosis. No bony foraminal stenosis. C3-C4: Small disc bulge. No spinal canal stenosis. Severe bilateral facet degenerative change, left-greater-than-right. Moderate left and mild right neural foraminal narrowing. C4-C5: Small disc bulge. Severe left and mild right facet degenerative change.  Moderate to severe bilateral neural foraminal stenosis. No significant spinal canal stenosis. C5-C6: Small disc bulge. No significant spinal canal stenosis. Moderate bilateral facet degenerative change, left-greater-than-right. Moderate bilateral neural foraminal stenosis. C6-C7: Small disc bulge. Moderate right and mild left facet degenerative change. Mild-to-moderate bilateral lateral neural foraminal stenosis. No significant spinal canal stenosis. Ligamentum flavum calcification. C7-T1: Small disc bulge. No spinal canal stenosis. Severe right and moderate left neural foraminal stenosis. Upper chest: Negative. Other: None. IMPRESSION: 1. No CT evidence of intracranial injury. 2. No CT evidence of facial bone fracture. 3. No CT evidence of cervical spine fracture 4. Multilevel degenerative changes in the cervical spine, as described above. Electronically Signed   By: Marin Roberts M.D.   On: 11/11/2021 17:11   DG Knee Complete 4 Views Right  Result Date: 11/11/2021 CLINICAL DATA:  Fall, right knee pain anteriorly. EXAM: RIGHT KNEE - COMPLETE 4+ VIEW COMPARISON:  12/25/2016 FINDINGS: There is deformity of the lateral tibial plateau. This is probably residual from the patient's prior fracture on 12/25/2016. Bony demineralization. Spurring of the tibial spine and marginal spurring of the patella. Indeterminate for knee effusion do see the degree of knee flexion (77 degrees) on the lateral projection. The patella appears unremarkable.  No foreign body observed. IMPRESSION: 1. Deformity of the lateral tibial plateau posteriorly, probably residual deformity related to the fracture from 12/25/2016. If the patient is not able to bear weight then CT or MRI would be recommended to ensure that there is not a new fracture in this vicinity. 2. Bony demineralization. 3. Mild degenerative spurring. Electronically Signed   By: Van Clines M.D.   On: 11/11/2021 16:44    PROCEDURES:  Critical Care performed:  No  Procedures   MEDICATIONS ORDERED IN ED: Medications - No data to display   IMPRESSION / MDM / [place] / ED COURSE  I reviewed the triage vital signs and the nursing notes.  Differential diagnosis includes, but is not limited to, fall, skull fracture, intracranial hemorrhage, facial fracture, nasal fracture, shoulder fracture, knee fracture  Patient's presentation is most consistent with acute presentation with potential threat to life or bodily function.   Patient's diagnosis is consistent with fall, patellar fracture.  Patient presented to the emergency department after a mechanical fall.  Patient did hit her face, landed on her right knee.  Imaging initially of the right knee was concerning for possible fracture versus old callus formation.  Given the fact that the patient did land on this this knee I ordered CT scan for further evaluation.  There is a subtle fracture along the posterior wall of the patella.  As such patient will be placed in a knee immobilizer.  Remainder of imaging was reassuring.  Patient remains neurologically intact and I feel no further work-up deemed necessary at this time.  Patient again will be placed in knee immobilizer.  Follow-up with orthopedics..  Patient is given ED precautions to return to the ED for any worsening or new symptoms.        FINAL CLINICAL IMPRESSION(S) / ED DIAGNOSES   Final diagnoses:  Fall, initial encounter  Other closed fracture of right patella, initial encounter     Rx / DC Orders   ED Discharge Orders     None        Note:  This document was prepared using Dragon voice recognition software and may include unintentional dictation errors.   Brynda Peon 11/11/21 2129    Harvest Dark, MD 11/11/21 2328

## 2021-11-11 NOTE — ED Notes (Signed)
Pt assisted to toilet 

## 2021-11-11 NOTE — ED Notes (Signed)
DC ppw provided. follow and rx information reviewed as applicable. pt provides verbal consent for dc at this time. Pt ambulatory off unit.

## 2021-12-24 NOTE — H&P (Signed)
Pre-Procedure H&P   Patient ID: Sheena Morgan is a 71 y.o. female.  Gastroenterology Provider: [name], DO  Referring Provider: Dawson Bills, NP PCP: Tracie Harrier, MD  Date: 12/25/2021  HPI Ms. Sheena Morgan is a 71 y.o. female who presents today for Colonoscopy for Surveillance-personal history of colon polyps.  Last underwent colonoscopy in December 2017 with 3 tubular adenomas and 1 submucosal leiomyoma.  Sigmoid diverticulosis and internal hemorrhoids were also noted.  Bowel movements have been regular without melena or hematochezia.  Most recent lab work hemoglobin 12 MCV 87 platelets 239,000 creatinine 0.7 ferritin 12 A1c 6.9  Ultrasound with fatty liver disease and dilated CBD at 11 mm without change from previous studies.  PD was mildly dilated 2 mm however follow-up MRCP did not demonstrate dilation but did demonstrate pancreatic divisum.  2017 GES normal.  CT with fatty liver disease EGD March 2017 with small hiatal hernia.  Gastritis negative for H. pylori and gastric intestinal metaplasia.  Has also undergone EGDs in 2004 2008 and 2011  She is status postcholecystectomy and tubal ligation.  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    Restless leg syndrome    Thyroid nodule    UTI (lower urinary tract infection)    Vertigo     Past Surgical History:  Procedure Laterality Date   CHOLECYSTECTOMY     COLONOSCOPY WITH PROPOFOL N/A 02/06/2016   Procedure: COLONOSCOPY WITH PROPOFOL;   Surgeon: Manya Silvas, MD;  Location: [place];  Service: Endoscopy;  Laterality: N/A;   DILATION AND CURETTAGE OF UTERUS     ESOPHAGOGASTRODUODENOSCOPY (EGD) WITH PROPOFOL N/A 05/29/2015   Procedure: ESOPHAGOGASTRODUODENOSCOPY (EGD) WITH PROPOFOL;  Surgeon: Manya Silvas, MD;  Location: [facility] ENDOSCOPY;  Service: Endoscopy;  Laterality: N/A;   THYROIDECTOMY N/A 01/29/2015   Procedure: THYROIDECTOMY/ LARYNGEAL NERVE MONITORING.;  Surgeon: Clyde Canterbury, MD;  Location: [facility] ORS;  Service: ENT;  Laterality: N/A;   TUBAL LIGATION      Family History Possibly gastric ca in father No h/o GI disease or malignancy  Review of Systems  Constitutional:  Negative for activity change, appetite change, chills, diaphoresis, fatigue, fever and unexpected weight change.  HENT:  Negative for trouble swallowing and voice change.   Respiratory:  Negative for shortness of breath and wheezing.   Cardiovascular:  Negative for chest pain, palpitations and leg swelling.  Gastrointestinal:  Negative for abdominal distention, abdominal pain, anal bleeding, blood in stool, constipation, diarrhea, nausea, rectal pain and vomiting.  Musculoskeletal:  Negative for arthralgias and myalgias.  Skin:  Negative for color change and pallor.  Neurological:  Negative for dizziness, syncope and weakness.  Psychiatric/Behavioral:  Negative for confusion.   All other systems reviewed and are negative.    Medications No current facility-administered medications on file prior to encounter.   Current Outpatient Medications on File Prior to Encounter  Medication Sig Dispense Refill   aspirin EC 81  MG tablet Take 81 mg by mouth daily.     Calcium Carb-Cholecalciferol (CALCIUM 500+D3 PO) Take 1 tablet by mouth 2 (two) times daily.     glipiZIDE (GLUCOTROL) 10 MG tablet Take 10 mg by mouth 2 (two) times daily after a meal.     hydrochlorothiazide (MICROZIDE) 12.5 MG capsule Take 12.5 mg by mouth daily.     latanoprost  (XALATAN) 0.005 % ophthalmic solution Place 1 drop into both eyes at bedtime.     levothyroxine (SYNTHROID, LEVOTHROID) 112 MCG tablet Take by mouth.     losartan (COZAAR) 50 MG tablet Take 50 mg by mouth at bedtime.     metFORMIN (GLUCOPHAGE) 1000 MG tablet Take 1,000 mg by mouth 2 (two) times daily with a meal.     Omega-3 Fatty Acids (FISH OIL) 1000 MG CAPS Take by mouth.     pravastatin (PRAVACHOL) 40 MG tablet Take 40 mg by mouth at bedtime.     conjugated estrogens (PREMARIN) vaginal cream Place 1 Applicatorful vaginally 2 (two) times a week. (Patient not taking: Reported on 12/25/2021) 42.5 g 12   docusate sodium (COLACE) 100 MG capsule Take 1 capsule (100 mg total) by mouth 2 (two) times daily. (Patient not taking: Reported on 02/06/2016) 10 capsule 0   HYDROcodone-acetaminophen (NORCO/VICODIN) 5-325 MG tablet Take 1-2 tablets by mouth every 4 (four) hours as needed for moderate pain. 30 tablet 0   insulin aspart (NOVOLOG) 100 UNIT/ML injection Inject 20 Units into the skin 2 (two) times daily before a meal. (Patient not taking: Reported on 12/25/2021)     LANTUS SOLOSTAR 100 UNIT/ML Solostar Pen  (Patient not taking: Reported on 12/25/2021)  4   Liraglutide (VICTOZA) 18 MG/3ML SOPN Inject into the skin daily before breakfast. (Patient not taking: Reported on 12/25/2021)     NITROFURANTOIN MONOHYD MACRO PO Take 100 mg by mouth daily. (Patient not taking: Reported on 12/25/2021)     omeprazole (PRILOSEC) 40 MG capsule      ondansetron (ZOFRAN) 4 MG tablet   2   ondansetron (ZOFRAN-ODT) 4 MG disintegrating tablet   0   oxyCODONE-acetaminophen (ROXICET) 5-325 MG tablet Take 1-2 tablets by mouth every 6 (six) hours as needed. (Patient not taking: Reported on 12/25/2021) 12 tablet 0   ranitidine (ZANTAC) 300 MG tablet Take 300 mg by mouth 2 (two) times daily. (Patient not taking: Reported on 12/25/2021)     sucralfate (CARAFATE) 1 g tablet Take by mouth.      Pertinent medications related to  GI and procedure were reviewed by me with the patient prior to the procedure   Current Facility-Administered Medications:    0.9 %  sodium chloride infusion, , Intravenous, Continuous, [name], DO, Last Rate: 20 mL/hr at 12/25/21 0959, 1,000 mL at 12/25/21 4665      Allergies  Allergen Reactions   Lisinopril Cough   Sulfa Antibiotics Swelling   Allergies were reviewed by me prior to the procedure  Objective   Body mass index is 22 kg/m. Vitals:   12/25/21 0944  BP: 126/67  Pulse: 90  Resp: 16  Temp: (!) 96.8 F (36 C)  TempSrc: Temporal  SpO2: 100%  Weight: 55.4 kg  Height: 5' 2.5" (1.588 m)     Physical Exam Vitals and nursing note reviewed.  Constitutional:      General: She is not in acute distress.    Appearance: Normal appearance. She is not ill-appearing, toxic-appearing or diaphoretic.  HENT:     Head:  Normocephalic and atraumatic.     Nose: Nose normal.     Mouth/Throat:     Mouth: Mucous membranes are moist.     Pharynx: Oropharynx is clear.  Eyes:     General: No scleral icterus.    Extraocular Movements: Extraocular movements intact.  Cardiovascular:     Rate and Rhythm: Normal rate and regular rhythm.     Heart sounds: Normal heart sounds. No murmur heard.    No friction rub. No gallop.  Pulmonary:     Effort: Pulmonary effort is normal. No respiratory distress.     Breath sounds: Normal breath sounds. No wheezing, rhonchi or rales.  Abdominal:     General: Abdomen is flat. Bowel sounds are normal. There is no distension.     Palpations: Abdomen is soft.     Tenderness: There is no abdominal tenderness. There is no guarding or rebound.  Musculoskeletal:     Cervical back: Neck supple.     Right lower leg: No edema.     Left lower leg: No edema.  Skin:    General: Skin is warm and dry.     Coloration: Skin is not jaundiced or pale.  Neurological:     General: No focal deficit present.     Mental Status: She is alert and  oriented to person, place, and time. Mental status is at baseline.  Psychiatric:        Mood and Affect: Mood normal.        Behavior: Behavior normal.        Thought Content: Thought content normal.        Judgment: Judgment normal.      Assessment:  Ms. Sheena Morgan is a 71 y.o. female  who presents today for Colonoscopy for Surveillance-personal history of colon polyps.  Plan:  Colonoscopy with possible intervention today  Colonoscopy with possible biopsy, control of bleeding, polypectomy, and interventions as necessary has been discussed with the patient/patient representative. Informed consent was obtained from the patient/patient representative after explaining the indication, nature, and risks of the procedure including but not limited to death, bleeding, perforation, missed neoplasm/lesions, cardiorespiratory compromise, and reaction to medications. Opportunity for questions was given and appropriate answers were provided. Patient/patient representative has verbalized understanding is amenable to undergoing the procedure.   [name], DO  [facility] Gastroenterology  Portions of the record may have been created with voice recognition software. Occasional wrong-word or 'sound-a-like' substitutions may have occurred due to the inherent limitations of voice recognition software.  Read the chart carefully and recognize, using context, where substitutions may have occurred.

## 2021-12-25 ENCOUNTER — Encounter
Admission: RE | Disposition: A | Discharge status: Home / Self Care | Payer: Self-pay | Source: Home / Self Care | Attending: Gastroenterology

## 2021-12-25 ENCOUNTER — Ambulatory Visit
Admission: RE | Admit: 2021-12-25 | Discharge: 2021-12-25 | Disposition: A | Discharge status: Home / Self Care | Payer: Medicare Other | Attending: Gastroenterology | Admitting: Gastroenterology

## 2021-12-25 ENCOUNTER — Ambulatory Visit: Payer: Medicare Other | Admitting: Anesthesiology

## 2021-12-25 ENCOUNTER — Encounter: Payer: Self-pay | Admitting: Gastroenterology

## 2021-12-25 DIAGNOSIS — K5730 Diverticulosis of large intestine without perforation or abscess without bleeding: Secondary | ICD-10-CM | POA: Diagnosis not present

## 2021-12-25 DIAGNOSIS — I129 Hypertensive chronic kidney disease with stage 1 through stage 4 chronic kidney disease, or unspecified chronic kidney disease: Secondary | ICD-10-CM | POA: Insufficient documentation

## 2021-12-25 DIAGNOSIS — Z7984 Long term (current) use of oral hypoglycemic drugs: Secondary | ICD-10-CM | POA: Diagnosis not present

## 2021-12-25 DIAGNOSIS — E890 Postprocedural hypothyroidism: Secondary | ICD-10-CM | POA: Insufficient documentation

## 2021-12-25 DIAGNOSIS — K640 First degree hemorrhoids: Secondary | ICD-10-CM | POA: Insufficient documentation

## 2021-12-25 DIAGNOSIS — Z794 Long term (current) use of insulin: Secondary | ICD-10-CM | POA: Insufficient documentation

## 2021-12-25 DIAGNOSIS — Z85828 Personal history of other malignant neoplasm of skin: Secondary | ICD-10-CM | POA: Insufficient documentation

## 2021-12-25 DIAGNOSIS — Z9049 Acquired absence of other specified parts of digestive tract: Secondary | ICD-10-CM | POA: Diagnosis not present

## 2021-12-25 DIAGNOSIS — E1122 Type 2 diabetes mellitus with diabetic chronic kidney disease: Secondary | ICD-10-CM | POA: Insufficient documentation

## 2021-12-25 DIAGNOSIS — N189 Chronic kidney disease, unspecified: Secondary | ICD-10-CM | POA: Insufficient documentation

## 2021-12-25 DIAGNOSIS — K589 Irritable bowel syndrome without diarrhea: Secondary | ICD-10-CM | POA: Diagnosis not present

## 2021-12-25 DIAGNOSIS — Z1211 Encounter for screening for malignant neoplasm of colon: Secondary | ICD-10-CM | POA: Insufficient documentation

## 2021-12-25 DIAGNOSIS — Z86010 Personal history of colonic polyps: Secondary | ICD-10-CM | POA: Insufficient documentation

## 2021-12-25 DIAGNOSIS — K219 Gastro-esophageal reflux disease without esophagitis: Secondary | ICD-10-CM | POA: Insufficient documentation

## 2021-12-25 HISTORY — PX: COLONOSCOPY: SHX5424

## 2021-12-25 SURGERY — COLONOSCOPY
Anesthesia: General

## 2021-12-25 MED ORDER — PROPOFOL 10 MG/ML IV BOLUS
INTRAVENOUS | Status: DC | PRN
  Administered 2021-12-25: 60 mg via INTRAVENOUS
  Administered 2021-12-25 (×3): 40 mg via INTRAVENOUS
  Administered 2021-12-25: 90 mg via INTRAVENOUS
  Administered 2021-12-25 (×2): 40 mg via INTRAVENOUS

## 2021-12-25 MED ORDER — SODIUM CHLORIDE 0.9 % IV SOLN
INTRAVENOUS | Status: DC
  Administered 2021-12-25: 1000 mL via INTRAVENOUS

## 2021-12-25 NOTE — Op Note (Signed)
[facility] Gastroenterology Patient Name: Sheena Morgan Procedure Date: 12/25/2021 10:06 AM MRN: [id number] Account #: [id number] Date of Birth: March 23, 1950 Admit Type: Outpatient Age: 71 Room: [facility] ENDO ROOM 1 Gender: Female Note Status: Finalized Instrument Name: Peds Colonoscope [id number] Procedure:             Colonoscopy Indications:           High risk colon cancer surveillance: Personal history                         of colonic polyps Providers:             [name] DO, DO Referring MD:          Tracie Harrier, MD (Referring MD) Medicines:             Monitored Anesthesia Care Complications:         No immediate complications. Estimated blood loss: None. Procedure:             Pre-Anesthesia Assessment:                        - Prior to the procedure, a History and Physical was                         performed, and patient medications and allergies were                         reviewed. The patient is competent. The risks and                         benefits of the procedure and the sedation options and                         risks were discussed with the patient. All questions                         were answered and informed consent was obtained.                         Patient identification and proposed procedure were                         verified by the physician, the nurse, the anesthetist                         and the technician in the endoscopy suite. Mental                         Status Examination: alert and oriented. Airway                         Examination: normal oropharyngeal airway and neck                         mobility. Respiratory Examination: clear to                         auscultation. CV Examination: RRR, no murmurs, no S3  or S4. Prophylactic Antibiotics: The patient does not                         require prophylactic antibiotics. Prior                         Anticoagulants: The  patient has taken no anticoagulant                         or antiplatelet agents. ASA Grade Assessment: III - A                         patient with severe systemic disease. After reviewing                         the risks and benefits, the patient was deemed in                         satisfactory condition to undergo the procedure. The                         anesthesia plan was to use monitored anesthesia care                         (MAC). Immediately prior to administration of                         medications, the patient was re-assessed for adequacy                         to receive sedatives. The heart rate, respiratory                         rate, oxygen saturations, blood pressure, adequacy of                         pulmonary ventilation, and response to care were                         monitored throughout the procedure. The physical                         status of the patient was re-assessed after the                         procedure.                        After obtaining informed consent, the colonoscope was                         passed under direct vision. Throughout the procedure,                         the patient's blood pressure, pulse, and oxygen                         saturations were monitored continuously. The  Colonoscope was introduced through the anus and                         advanced to the the terminal ileum, with                         identification of the appendiceal orifice and IC                         valve. The colonoscopy was performed without                         difficulty. The patient tolerated the procedure well.                         The quality of the bowel preparation was evaluated                         using the BBPS [facility] Bowel Preparation Scale) with                         scores of: Right Colon = 3, Transverse Colon = 3 and                         Left Colon = 3 (entire mucosa seen well with  no                         residual staining, small fragments of stool or opaque                         liquid). The total BBPS score equals 9. The terminal                         ileum, ileocecal valve, appendiceal orifice, and                         rectum were photographed. Findings:      The perianal and digital rectal examinations were normal. Pertinent       negatives include normal sphincter tone.      Multiple small-mouthed diverticula were found in the left colon.       Estimated blood loss: none.      Non-bleeding internal hemorrhoids were found during retroflexion. The       hemorrhoids were Grade I (internal hemorrhoids that do not prolapse).       Estimated blood loss: none.      The exam was otherwise without abnormality on direct and retroflexion       views. Impression:            - Diverticulosis in the left colon.                        - Non-bleeding internal hemorrhoids.                        - The examination was otherwise normal on direct and                         retroflexion views.                        -  No specimens collected. Recommendation:        - Patient has a contact number available for                         emergencies. The signs and symptoms of potential                         delayed complications were discussed with the patient.                         Return to normal activities tomorrow. Written                         discharge instructions were provided to the patient.                        - Discharge patient to home.                        - Resume previous diet.                        - Continue present medications.                        - Return to referring physician as previously                         scheduled.                        - Repeat colonsocopy would be in 10 years, patient                         will be past screening age at that point and can                         discontinue further screenings.                         - The findings and recommendations were discussed with                         the patient. Procedure Code(s):     --- Professional ---                        [id number], Colonoscopy, flexible; diagnostic, including                         collection of specimen(s) by brushing or washing, when                         performed (separate procedure) Diagnosis Code(s):     --- Professional ---                        Z86.010, Personal history of colonic polyps                        K64.0, First degree hemorrhoids  K57.30, Diverticulosis of large intestine without                         perforation or abscess without bleeding CPT copyright 2022 American Medical Association. All rights reserved. The codes documented in this report are preliminary and upon coder review may  be revised to meet current compliance requirements. Attending Participation:      I personally performed the entire procedure. Volney American, DO [name] DO, DO 12/25/2021 10:42:07 AM This report has been signed electronically. Number of Addenda: 0 Note Initiated On: 12/25/2021 10:06 AM Scope Withdrawal Time: 0 hours 17 minutes 6 seconds  Total Procedure Duration: 0 hours 24 minutes 48 seconds  Estimated Blood Loss:  Estimated blood loss: none.      [facility]

## 2021-12-25 NOTE — Transfer of Care (Signed)
Immediate Anesthesia Transfer of Care Note  Patient: ROBI MITTER  Procedure(s) Performed: COLONOSCOPY  Patient Location: Endoscopy Unit  Anesthesia Type:General  Level of Consciousness: drowsy  Airway & Oxygen Therapy: Patient Spontanous Breathing and Patient connected to nasal cannula oxygen  Post-op Assessment: Report given to RN, Post -op Vital signs reviewed and stable and Patient moving all extremities  Post vital signs: Reviewed and stable  Last Vitals:  Vitals Value Taken Time  BP 104/57 12/25/21 1039  Temp 36.4 C 12/25/21 1038  Pulse 71 12/25/21 1043  Resp 28 12/25/21 1045  SpO2 100 % 12/25/21 1043  Vitals shown include unvalidated device data.  Last Pain:  Vitals:   12/25/21 1038  TempSrc: Temporal  PainSc: Asleep         Complications: No notable events documented.

## 2021-12-25 NOTE — Anesthesia Postprocedure Evaluation (Signed)
Anesthesia Post Note  Patient: Sheena Morgan  Procedure(s) Performed: COLONOSCOPY  Patient location during evaluation: Endoscopy Anesthesia Type: General Level of consciousness: awake and alert Pain management: pain level controlled Vital Signs Assessment: post-procedure vital signs reviewed and stable Respiratory status: spontaneous breathing, nonlabored ventilation, respiratory function stable and patient connected to nasal cannula oxygen Cardiovascular status: blood pressure returned to baseline and stable Postop Assessment: no apparent nausea or vomiting Anesthetic complications: no   No notable events documented.   Last Vitals:  Vitals:   12/25/21 1048 12/25/21 1058  BP: 104/67 120/78  Pulse: 75 69  Resp: (!) 25 17  Temp:    SpO2: 100% 100%    Last Pain:  Vitals:   12/25/21 1058  TempSrc:   PainSc: 0-No pain                 Precious Haws Genia Perin

## 2021-12-25 NOTE — Anesthesia Preprocedure Evaluation (Signed)
Anesthesia Evaluation  Patient identified by MRN, date of birth, ID band Patient awake    Reviewed: Allergy & Precautions, NPO status , Patient's Chart, lab work & pertinent test results  History of Anesthesia Complications Negative for: history of anesthetic complications  Airway Mallampati: III  TM Distance: <3 FB Neck ROM: full    Dental  (+) Chipped   Pulmonary neg pulmonary ROS, neg shortness of breath,    Pulmonary exam normal        Cardiovascular Exercise Tolerance: Good hypertension, (-) anginaNormal cardiovascular exam     Neuro/Psych negative neurological ROS  negative psych ROS   GI/Hepatic Neg liver ROS, GERD  Controlled,  Endo/Other  diabetes, Type 2Hypothyroidism   Renal/GU Renal disease  negative genitourinary   Musculoskeletal   Abdominal   Peds  Hematology negative hematology ROS (+)   Anesthesia Other Findings Past Medical History: No date: Atrophic vaginitis No date: Cancer ([place])     Comment:  Skin No date: Chronic kidney disease     Comment:  UTI No date: Colon polyps No date: Diabetes mellitus without complication ([facility]) No date: Diverticulosis of colon No date: Gastritis No date: Gastritis No date: GERD (gastroesophageal reflux disease) No date: Glaucoma (increased eye pressure) No date: Hypertension No date: Hypothyroidism No date: IBS (irritable bowel syndrome) No date: 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 No date: Restless leg syndrome No date: Thyroid nodule No date: UTI (lower urinary tract infection) No date: Vertigo  Past Surgical History: No date: CHOLECYSTECTOMY 02/06/2016: COLONOSCOPY WITH PROPOFOL; N/A     Comment:  Procedure: COLONOSCOPY WITH PROPOFOL;  Surgeon: Manya Silvas, MD;  Location: [facility] ENDOSCOPY;  Service:               Endoscopy;  Laterality: N/A; No date: DILATION AND CURETTAGE OF UTERUS 05/29/2015: ESOPHAGOGASTRODUODENOSCOPY (EGD) WITH  PROPOFOL; N/A     Comment:  Procedure: ESOPHAGOGASTRODUODENOSCOPY (EGD) WITH               PROPOFOL;  Surgeon: Manya Silvas, MD;  Location: [facility]              ENDOSCOPY;  Service: Endoscopy;  Laterality: N/A; 01/29/2015: THYROIDECTOMY; N/A     Comment:  Procedure: THYROIDECTOMY/ LARYNGEAL NERVE MONITORING.;                Surgeon: Clyde Canterbury, MD;  Location: [facility] ORS;  Service:              ENT;  Laterality: N/A; No date: TUBAL LIGATION  BMI    Body Mass Index: 22.00 kg/m      Reproductive/Obstetrics negative OB ROS                             Anesthesia Physical Anesthesia Plan  ASA: 3  Anesthesia Plan: General   Post-op Pain Management:    Induction: Intravenous  PONV Risk Score and Plan: Propofol infusion and TIVA  Airway Management Planned: Natural Airway and Nasal Cannula  Additional Equipment:   Intra-op Plan:   Post-operative Plan:   Informed Consent: I have reviewed the patients History and Physical, chart, labs and discussed the procedure including the risks, benefits and alternatives for the proposed anesthesia with the patient or authorized representative who has indicated his/her understanding and acceptance.     Dental Advisory Given  Plan Discussed with: Anesthesiologist, CRNA and Surgeon  Anesthesia  Plan Comments: (Patient consented for risks of anesthesia including but not limited to:  - adverse reactions to medications - risk of airway placement if required - damage to eyes, teeth, lips or other oral mucosa - nerve damage due to positioning  - sore throat or hoarseness - Damage to heart, brain, nerves, lungs, other parts of body or loss of life  Patient voiced understanding.)        Anesthesia Quick Evaluation

## 2021-12-25 NOTE — Interval H&P Note (Signed)
History and Physical Interval Note: Preprocedure H&P from 12/25/21  was reviewed and there was no interval change after seeing and examining the patient.  Written consent was obtained from the patient after discussion of risks, benefits, and alternatives. Patient has consented to proceed with Colonoscopy with possible intervention   12/25/2021 10:03 AM  Sheena Morgan  has presented today for surgery, with the diagnosis of Hx of adenomatous colonic polyps (Z86.010).  The various methods of treatment have been discussed with the patient and family. After consideration of risks, benefits and other options for treatment, the patient has consented to  Procedure(s): COLONOSCOPY (N/A) as a surgical intervention.  The patient's history has been reviewed, patient examined, no change in status, stable for surgery.  I have reviewed the patient's chart and labs.  Questions were answered to the patient's satisfaction.     [name]

## 2022-11-11 ENCOUNTER — Other Ambulatory Visit: Payer: Self-pay | Admitting: Internal Medicine

## 2022-11-11 DIAGNOSIS — Z1231 Encounter for screening mammogram for malignant neoplasm of breast: Secondary | ICD-10-CM

## 2022-11-17 ENCOUNTER — Ambulatory Visit
Admission: RE | Admit: 2022-11-17 | Discharge: 2022-11-17 | Disposition: A | Discharge status: Home / Self Care | Payer: Medicare Other | Source: Ambulatory Visit | Attending: Internal Medicine | Admitting: Internal Medicine

## 2022-11-17 DIAGNOSIS — Z1231 Encounter for screening mammogram for malignant neoplasm of breast: Secondary | ICD-10-CM | POA: Insufficient documentation

## 2024-01-03 ENCOUNTER — Other Ambulatory Visit: Payer: Self-pay | Admitting: Internal Medicine

## 2024-01-03 DIAGNOSIS — Z1231 Encounter for screening mammogram for malignant neoplasm of breast: Secondary | ICD-10-CM

## 2024-02-09 ENCOUNTER — Ambulatory Visit
Admission: RE | Admit: 2024-02-09 | Discharge: 2024-02-09 | Disposition: A | Discharge status: Home / Self Care | Source: Ambulatory Visit | Attending: Internal Medicine | Admitting: Internal Medicine

## 2024-02-09 DIAGNOSIS — Z1231 Encounter for screening mammogram for malignant neoplasm of breast: Secondary | ICD-10-CM | POA: Insufficient documentation
